# Patient Record
Sex: Male | Born: 1975 | Race: Black or African American | Hispanic: No | Marital: Single | Smoking: Current some day smoker
Health system: Southern US, Community
[De-identification: ages and names within clinical notes are randomized; demographics above are authoritative.]

---

## 1998-01-27 ENCOUNTER — Emergency Department (HOSPITAL_COMMUNITY): Admission: EM | Admit: 1998-01-27 | Discharge: 1998-01-27 | Payer: Self-pay | Admitting: Emergency Medicine

## 1998-02-11 ENCOUNTER — Encounter: Admission: RE | Admit: 1998-02-11 | Discharge: 1998-02-11 | Payer: Self-pay | Admitting: Family Medicine

## 2000-05-03 ENCOUNTER — Emergency Department (HOSPITAL_COMMUNITY): Admission: EM | Admit: 2000-05-03 | Discharge: 2000-05-04 | Payer: Self-pay | Admitting: Emergency Medicine

## 2000-05-03 ENCOUNTER — Encounter: Payer: Self-pay | Admitting: Emergency Medicine

## 2000-05-04 ENCOUNTER — Encounter: Payer: Self-pay | Admitting: Emergency Medicine

## 2002-04-30 ENCOUNTER — Emergency Department (HOSPITAL_COMMUNITY): Admission: EM | Admit: 2002-04-30 | Discharge: 2002-04-30 | Payer: Self-pay | Admitting: Emergency Medicine

## 2002-05-23 ENCOUNTER — Emergency Department (HOSPITAL_COMMUNITY): Admission: EM | Admit: 2002-05-23 | Discharge: 2002-05-23 | Payer: Self-pay | Admitting: Emergency Medicine

## 2002-06-21 ENCOUNTER — Emergency Department (HOSPITAL_COMMUNITY): Admission: EM | Admit: 2002-06-21 | Discharge: 2002-06-21 | Payer: Self-pay | Admitting: Emergency Medicine

## 2004-02-23 ENCOUNTER — Emergency Department (HOSPITAL_COMMUNITY): Admission: EM | Admit: 2004-02-23 | Discharge: 2004-02-23 | Payer: Self-pay | Admitting: Emergency Medicine

## 2006-11-19 ENCOUNTER — Emergency Department (HOSPITAL_COMMUNITY): Admission: EM | Admit: 2006-11-19 | Discharge: 2006-11-19 | Payer: Self-pay | Admitting: Emergency Medicine

## 2007-05-14 ENCOUNTER — Emergency Department (HOSPITAL_COMMUNITY): Admission: EM | Admit: 2007-05-14 | Discharge: 2007-05-14 | Payer: Self-pay | Admitting: Emergency Medicine

## 2009-01-07 ENCOUNTER — Emergency Department (HOSPITAL_COMMUNITY): Admission: EM | Admit: 2009-01-07 | Discharge: 2009-01-07 | Payer: Self-pay | Admitting: Emergency Medicine

## 2013-07-12 ENCOUNTER — Encounter (HOSPITAL_COMMUNITY): Payer: Self-pay | Admitting: Emergency Medicine

## 2013-07-12 ENCOUNTER — Emergency Department (HOSPITAL_COMMUNITY)
Admission: EM | Admit: 2013-07-12 | Discharge: 2013-07-12 | Discharge status: Against Medical Advice | Payer: Self-pay | Attending: Emergency Medicine | Admitting: Emergency Medicine

## 2013-07-12 DIAGNOSIS — Z87898 Personal history of other specified conditions: Secondary | ICD-10-CM

## 2013-07-12 DIAGNOSIS — F17200 Nicotine dependence, unspecified, uncomplicated: Secondary | ICD-10-CM | POA: Insufficient documentation

## 2013-07-12 DIAGNOSIS — F1010 Alcohol abuse, uncomplicated: Secondary | ICD-10-CM | POA: Insufficient documentation

## 2013-07-12 NOTE — ED Notes (Signed)
Bed: ZO10 Expected date: 07/12/13 Expected time: 10:18 PM Means of arrival: Ambulance Comments: ETOH

## 2013-07-12 NOTE — ED Notes (Signed)
Pt left AMA after talking with Dr Ranae Palms

## 2013-07-12 NOTE — ED Notes (Signed)
Pt arrived via EMS with a complaint of alcohol intoxication.  Pt reportedly has been drinking all day and EMS was called by family when he became unresponsive and had shallow breathing.  Pt's family told EMS that he had a syncopal episode and that was what they were called for.  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   Pt refused IV start by EMS and has been back and forth as to what his intentions are.  At this writing pt wants help to stop drinking.

## 2013-07-12 NOTE — ED Provider Notes (Signed)
EMS called for questionable unresponsive episode after drinking. Patient is now requesting to leave emergency department before evaluation. He is alert and oriented x4. He has no clinical signs of intoxication such slurred speech or gait disturbance. He is ambulating without assistance. He appears to have decision-making capacity. He moves all extremities without deficit. He has no obvious trauma. He is refusing further examination. Patient left the emergency department AGAINST MEDICAL ADVICE.  Loren Racer, MD 07/12/13 [id number]

## 2013-07-12 NOTE — ED Notes (Signed)
Pt walked to the bathroom without any assistance and provided a urine sample

## 2013-07-12 NOTE — ED Notes (Signed)
Pt walked out after talking with Dr.  Rock Nephew made an indication that he wasn't going to sign any paperwork.  Pt left AMA

## 2021-08-15 ENCOUNTER — Inpatient Hospital Stay (HOSPITAL_COMMUNITY)
Admission: EM | Admit: 2021-08-15 | Discharge: 2021-08-18 | DRG: 472 | Disposition: A | Discharge status: Home / Self Care | Payer: Self-pay | Attending: Neurological Surgery | Admitting: Neurological Surgery

## 2021-08-15 ENCOUNTER — Other Ambulatory Visit: Payer: Self-pay

## 2021-08-15 ENCOUNTER — Encounter (HOSPITAL_COMMUNITY): Payer: Self-pay | Admitting: *Deleted

## 2021-08-15 ENCOUNTER — Emergency Department (HOSPITAL_COMMUNITY): Payer: Self-pay

## 2021-08-15 DIAGNOSIS — M545 Low back pain, unspecified: Secondary | ICD-10-CM | POA: Diagnosis present

## 2021-08-15 DIAGNOSIS — R202 Paresthesia of skin: Secondary | ICD-10-CM

## 2021-08-15 DIAGNOSIS — Z20822 Contact with and (suspected) exposure to covid-19: Secondary | ICD-10-CM | POA: Diagnosis present

## 2021-08-15 DIAGNOSIS — G992 Myelopathy in diseases classified elsewhere: Secondary | ICD-10-CM | POA: Diagnosis present

## 2021-08-15 DIAGNOSIS — G8929 Other chronic pain: Secondary | ICD-10-CM | POA: Diagnosis present

## 2021-08-15 DIAGNOSIS — R269 Unspecified abnormalities of gait and mobility: Secondary | ICD-10-CM

## 2021-08-15 DIAGNOSIS — F17200 Nicotine dependence, unspecified, uncomplicated: Secondary | ICD-10-CM | POA: Diagnosis present

## 2021-08-15 DIAGNOSIS — M4802 Spinal stenosis, cervical region: Principal | ICD-10-CM | POA: Diagnosis present

## 2021-08-15 DIAGNOSIS — Z419 Encounter for procedure for purposes other than remedying health state, unspecified: Secondary | ICD-10-CM

## 2021-08-15 DIAGNOSIS — G959 Disease of spinal cord, unspecified: Secondary | ICD-10-CM | POA: Diagnosis present

## 2021-08-15 DIAGNOSIS — R200 Anesthesia of skin: Secondary | ICD-10-CM

## 2021-08-15 NOTE — ED Triage Notes (Signed)
The pt is c/o his lt leg being numb for two omnths  no pain

## 2021-08-15 NOTE — ED Provider Triage Note (Signed)
Emergency Medicine Provider Triage Evaluation Note  Douglas Mclaughlin , a 46 y.o. male  was evaluated in triage.  Pt complains of left leg issue onset 2 months.  He notes that he when he works his job he sometimes feels like there is a question to the top of his knee.  Hasn't tried any medications for his symptoms. Denies back pain, fever, chills, color change, wound.  Denies any new injury or trauma.  Review of Systems  Positive: As per HPI.  Negative: Back pain, fever, chills  Physical Exam  BP 128/80 (BP Location: Right Arm)    Pulse 74    Temp 98.4 F (36.9 C) (Oral)    Resp 17    Ht 6\' 1"  (1.854 m)    Wt 77.1 kg    SpO2 97%    BMI 22.43 kg/m  Gen:   Awake, no distress   Resp:  Normal effort  MSK:   Moves extremities without difficulty  Other:  No tenderness to palpation noted to left knee.  No obvious deformity or overlying erythema or swelling noted.  No lumbar midline spinal tenderness to palpation.  No tenderness to palpation noted to lumbar musculature.  Decreased sensation noted to left knee.  Medical Decision Making  Medically screening exam initiated at 10:49 PM.  Appropriate orders placed.  was informed that the remainder of the evaluation will be completed by another provider, this initial triage assessment does not replace that evaluation, and the importance of remaining in the ED until their evaluation is complete.   Rileyann Florance A, PA-C 08/15/21 2309

## 2021-08-16 ENCOUNTER — Observation Stay (HOSPITAL_COMMUNITY): Payer: Self-pay

## 2021-08-16 ENCOUNTER — Emergency Department (HOSPITAL_COMMUNITY): Payer: Self-pay

## 2021-08-16 DIAGNOSIS — G959 Disease of spinal cord, unspecified: Secondary | ICD-10-CM | POA: Diagnosis present

## 2021-08-16 LAB — CBC WITH DIFFERENTIAL/PLATELET
Abs Immature Granulocytes: 0.02 10*3/uL (ref 0.00–0.07)
Basophils Absolute: 0 10*3/uL (ref 0.0–0.1)
Basophils Relative: 0 %
Eosinophils Absolute: 0 10*3/uL (ref 0.0–0.5)
Eosinophils Relative: 0 %
HCT: 45.7 % (ref 39.0–52.0)
Hemoglobin: 15.1 g/dL (ref 13.0–17.0)
Immature Granulocytes: 0 %
Lymphocytes Relative: 45 %
Lymphs Abs: 3.1 10*3/uL (ref 0.7–4.0)
MCH: 30.5 pg (ref 26.0–34.0)
MCHC: 33 g/dL (ref 30.0–36.0)
MCV: 92.3 fL (ref 80.0–100.0)
Monocytes Absolute: 0.4 10*3/uL (ref 0.1–1.0)
Monocytes Relative: 6 %
Neutro Abs: 3.4 10*3/uL (ref 1.7–7.7)
Neutrophils Relative %: 49 %
Platelets: 194 10*3/uL (ref 150–400)
RBC: 4.95 MIL/uL (ref 4.22–5.81)
RDW: 13.7 % (ref 11.5–15.5)
WBC: 6.9 10*3/uL (ref 4.0–10.5)
nRBC: 0 % (ref 0.0–0.2)

## 2021-08-16 LAB — CK: Total CK: 206 U/L (ref 49–397)

## 2021-08-16 LAB — COMPREHENSIVE METABOLIC PANEL
ALT: 12 U/L (ref 0–44)
AST: 16 U/L (ref 15–41)
Albumin: 4.2 g/dL (ref 3.5–5.0)
Alkaline Phosphatase: 73 U/L (ref 38–126)
Anion gap: 10 (ref 5–15)
BUN: 7 mg/dL (ref 6–20)
CO2: 24 mmol/L (ref 22–32)
Calcium: 9.4 mg/dL (ref 8.9–10.3)
Chloride: 105 mmol/L (ref 98–111)
Creatinine, Ser: 0.92 mg/dL (ref 0.61–1.24)
GFR, Estimated: >60 mL/min (ref >60)
Glucose, Bld: 84 mg/dL (ref 70–99)
Potassium: 3.9 mmol/L (ref 3.5–5.1)
Sodium: 139 mmol/L (ref 135–145)
Total Bilirubin: 1.3 mg/dL — ABNORMAL HIGH (ref 0.3–1.2)
Total Protein: 7.4 g/dL (ref 6.5–8.1)

## 2021-08-16 LAB — RESP PANEL BY RT-PCR (FLU A&B, COVID) ARPGX2
Influenza A by PCR: NEGATIVE
Influenza B by PCR: NEGATIVE
SARS Coronavirus 2 by RT PCR: NEGATIVE

## 2021-08-16 LAB — MRSA NEXT GEN BY PCR, NASAL: MRSA by PCR Next Gen: NOT DETECTED

## 2021-08-16 LAB — MAGNESIUM: Magnesium: 2 mg/dL (ref 1.7–2.4)

## 2021-08-16 IMAGING — CT CT HEAD W/O CM
4 series · 16 of 47 positions shown, 18 images · non-contrast
Comparison: None.

CLINICAL DATA: Neuro deficit, acute, stroke suspected. Two months
of left lower extremity numbness and weakness.



[Series 3: head without · axial · non-contrast · 0.46mm/px · z∈[-27,+93]mm · 7 of 34 slices shown, 9 images]
[im 5/34  brain]
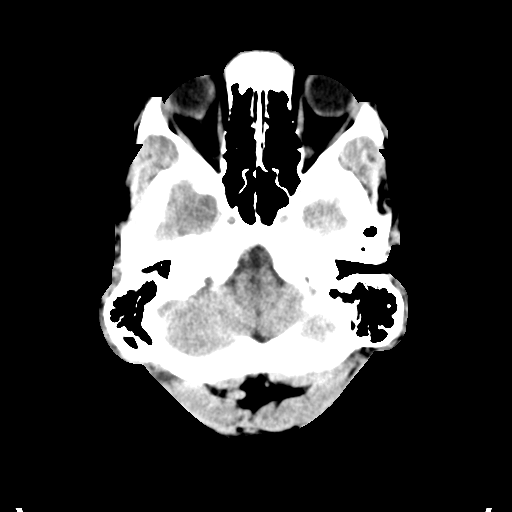
[im 5/34  bone]
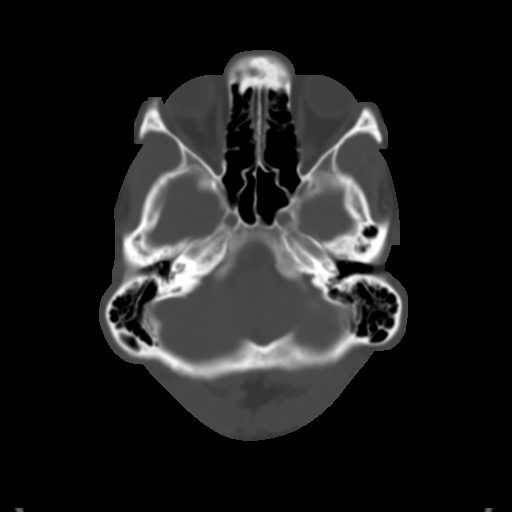
[im 9/34  brain]
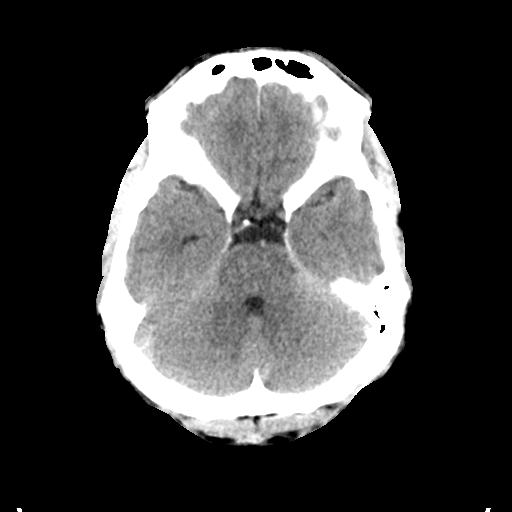
[im 13/34  brain]
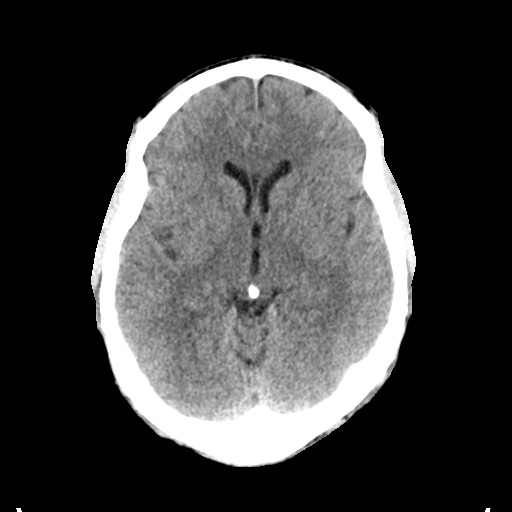
[im 17/34  brain]
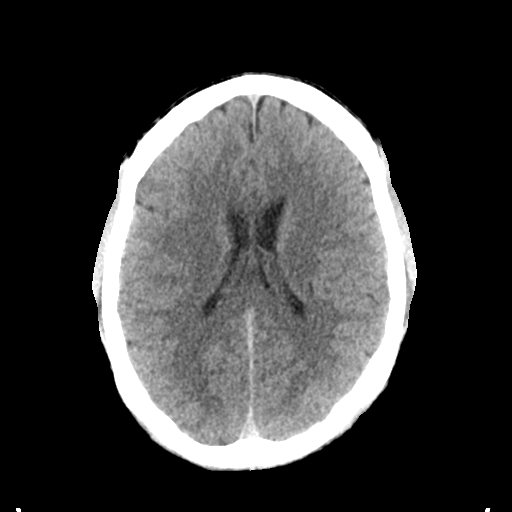
[im 21/34  brain]
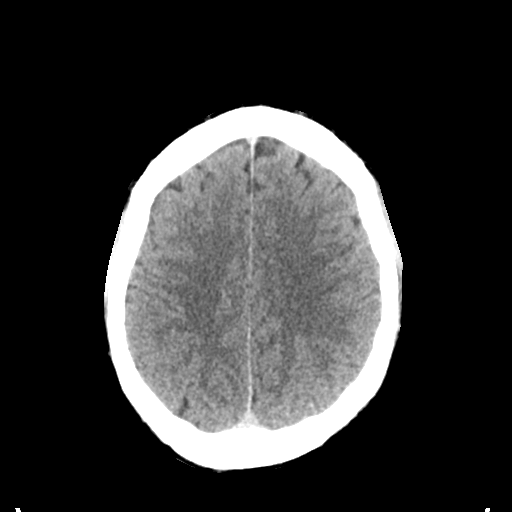
[im 21/34  bone]
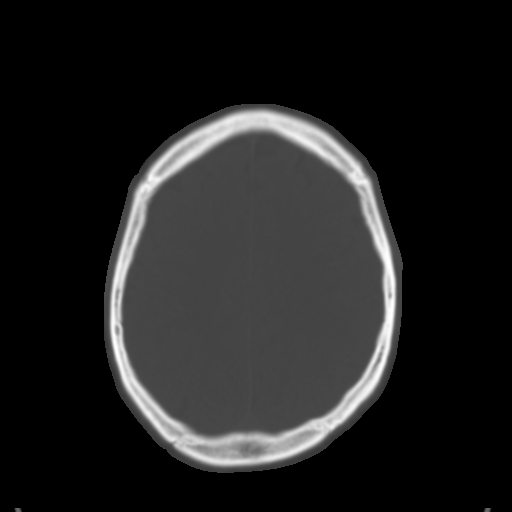
[im 25/34  brain]
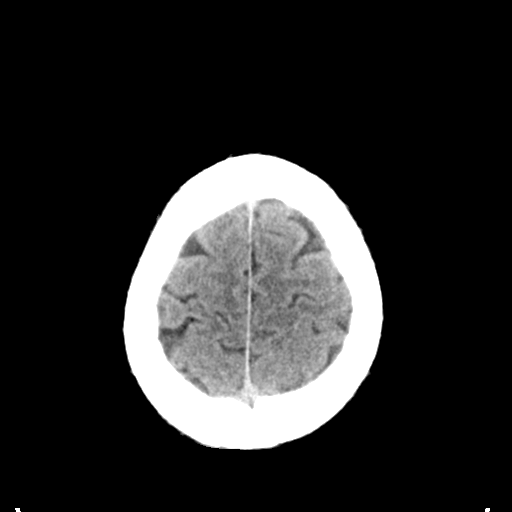
[im 29/34  brain]
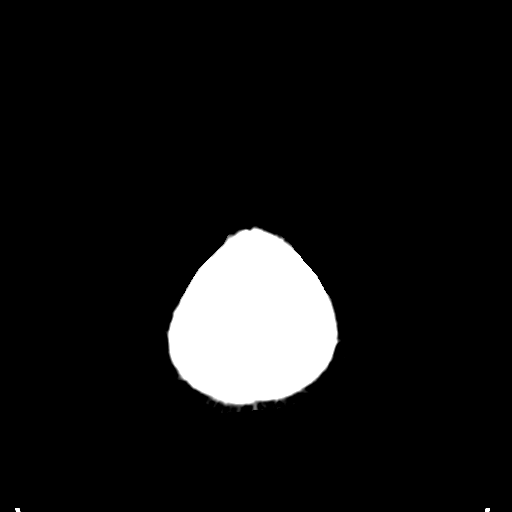

[Series 4: head bone · axial · 0.46mm/px · z∈[-31,+1]mm · 3 of 83 slices shown]
[im 9/83  bone]
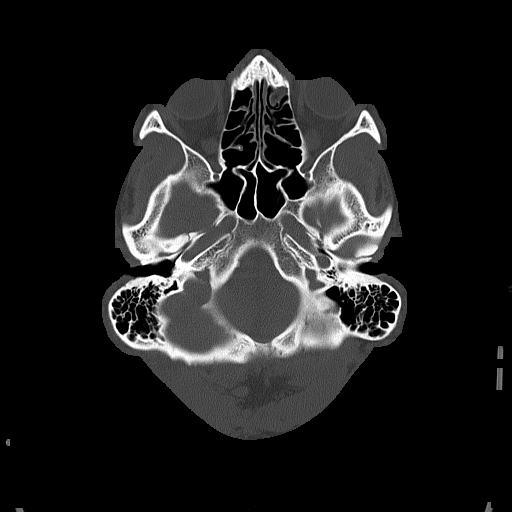
[im 17/83  bone]
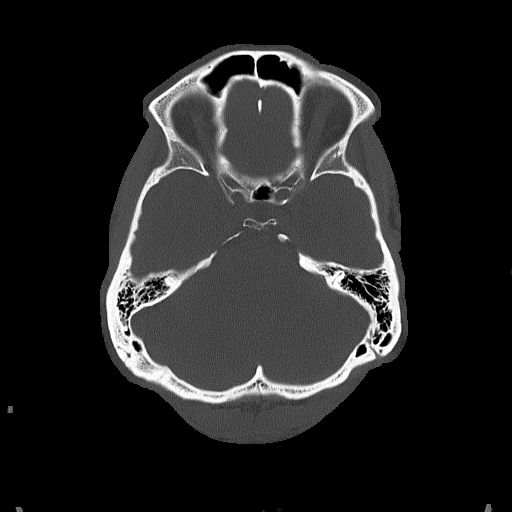
[im 25/83  bone]
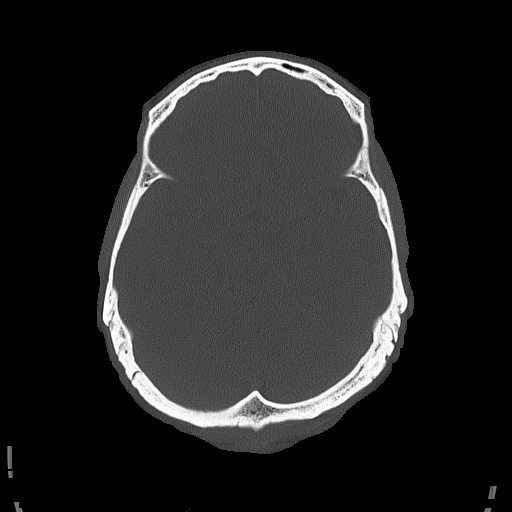

[Series 5: head without cor · coronal · non-contrast · 0.33mm/px · 3 of 74 slices shown]
[im 25/74  brain]
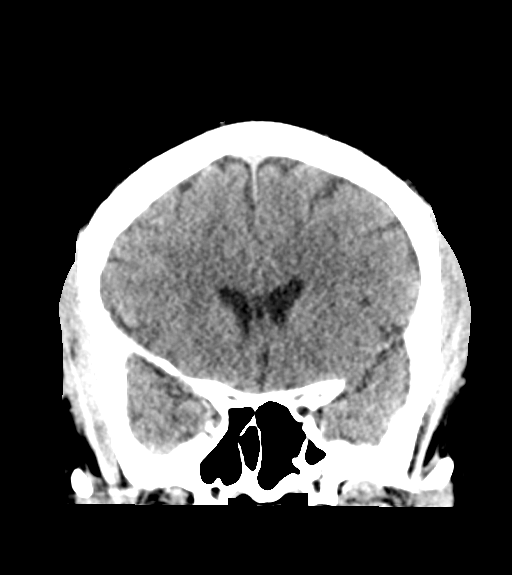
[im 33/74  brain]
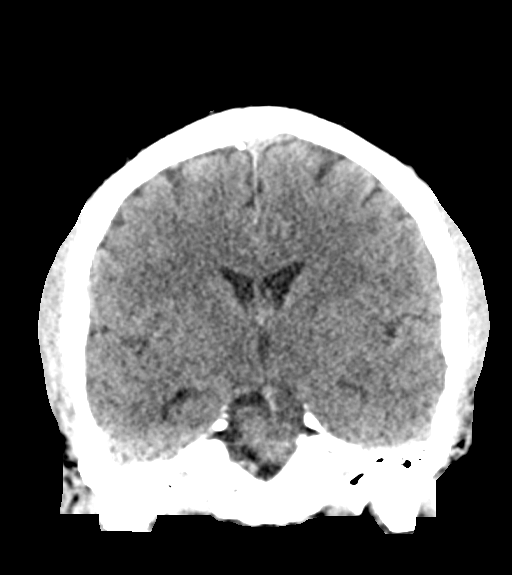
[im 41/74  brain]
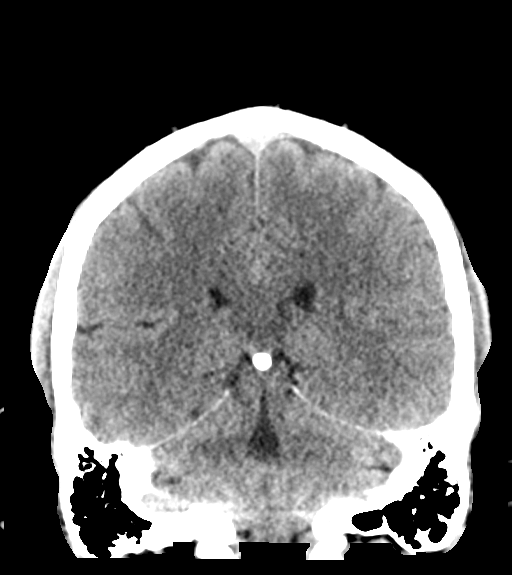

[Series 6: head without sag · sagittal · non-contrast · 0.36mm/px · 3 of 67 slices shown]
[im 23/67  brain]
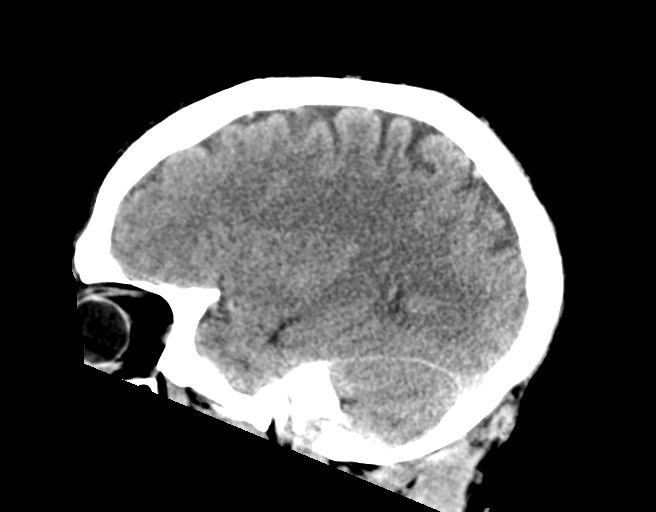
[im 34/67  brain]
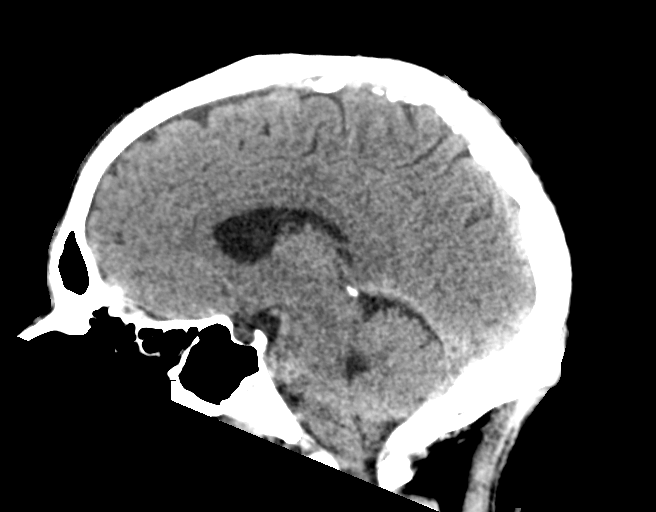
[im 45/67  brain]
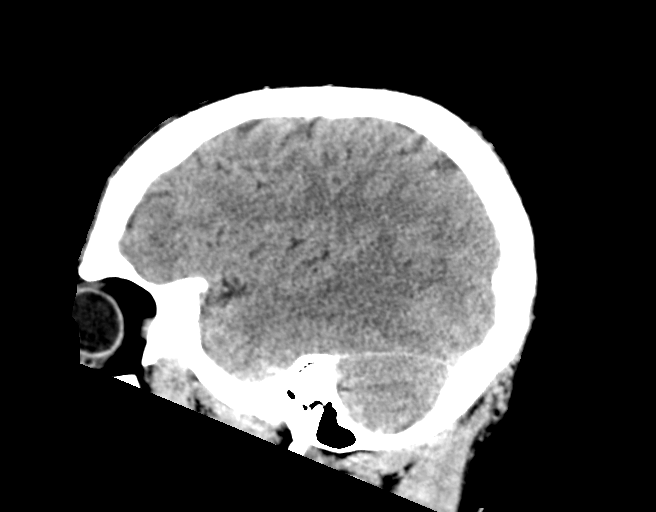

[16 of 47 positions shown; findings below may reference images not displayed]

FINDINGS: Brain: No acute intracranial hemorrhage, midline shift or mass
effect. No extra-axial fluid collection. Gray-white matter
differentiation is within normal limits. No hydrocephalus.

Vascular: No hyperdense vessel or unexpected calcification.

Skull: Normal. Negative for fracture or focal lesion.

Sinuses/Orbits: No acute finding.

Other: None.
IMPRESSION: No acute intracranial process.

## 2021-08-16 IMAGING — MR MR THORACIC SPINE W/O CM
4 of 6 series · 19 of 48 positions shown · non-contrast
Comparison: None.

CLINICAL DATA: Myelopathy, acute, thoracic spine versus cervical
spine

EXAM:
MRI THORACIC SPINE WITHOUT CONTRAST
TECHNIQUE: Multiplanar, multisequence MR imaging of the thoracic spine was
performed. No intravenous contrast was administered.

[Series 3: T1 · sagittal · 3.0mm · 0.90mm/px · 3 of 15 slices shown (1 of 2)]
[im 1/15]
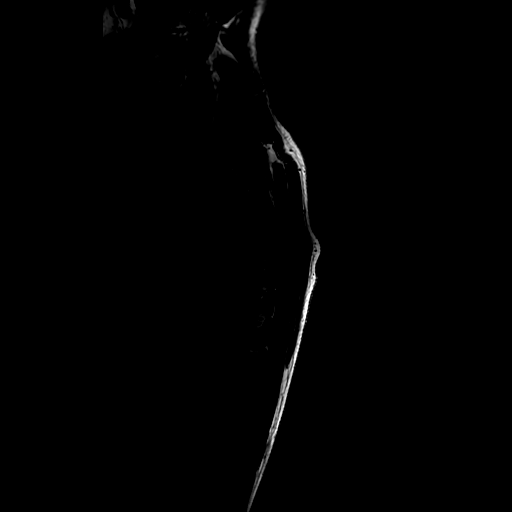
[im 8/15]
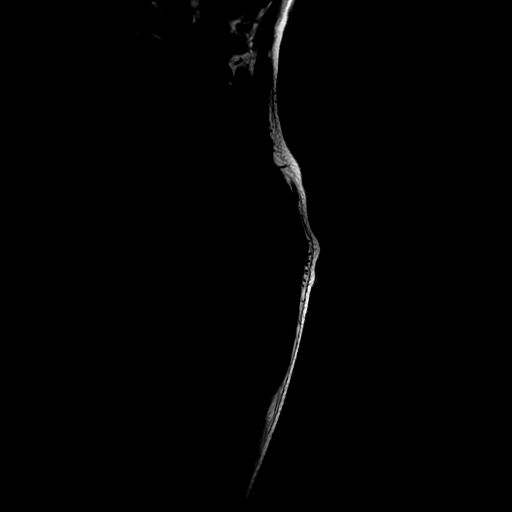
[im 15/15]
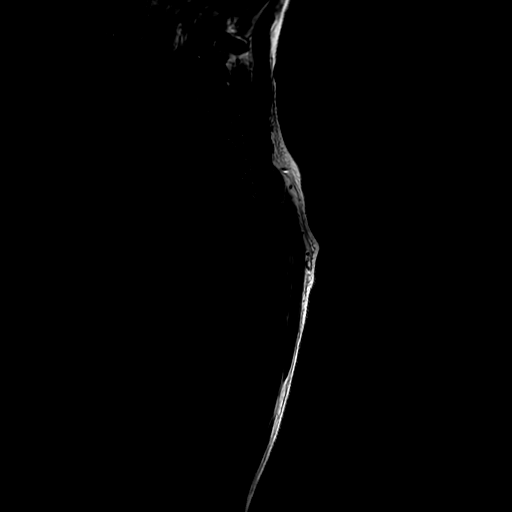

[Series 4: T2 · sagittal · 3.0mm · 0.66mm/px · 5 of 17 slices shown (1 of 2)]
[im 1/17]
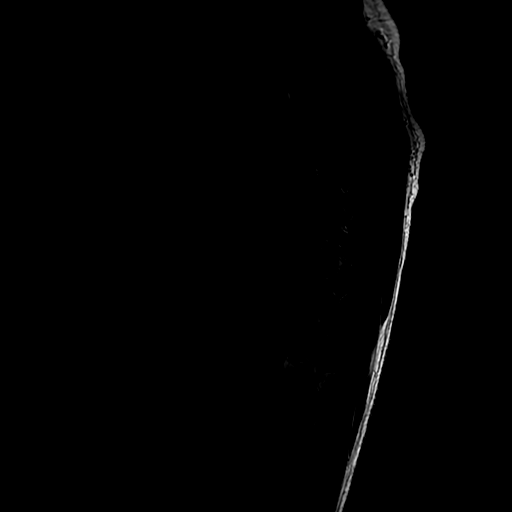
[im 5/17]
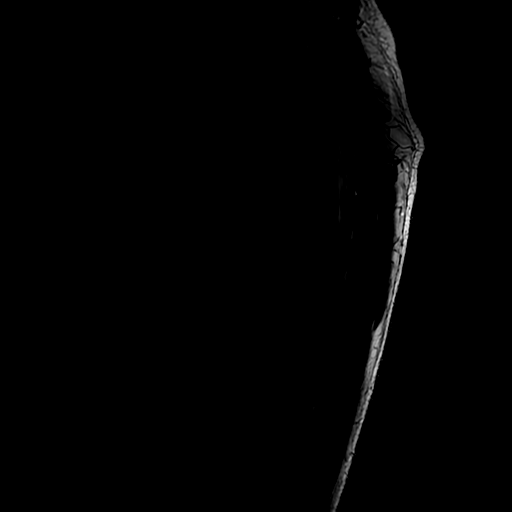
[im 9/17]
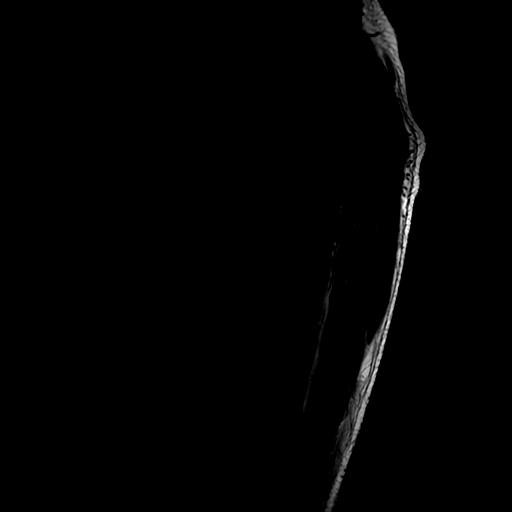
[im 13/17]
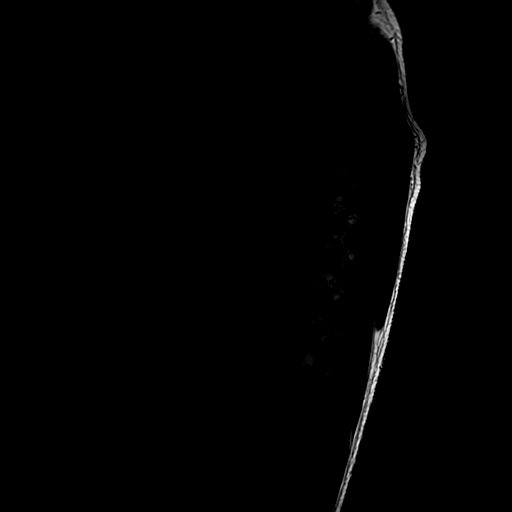
[im 17/17]
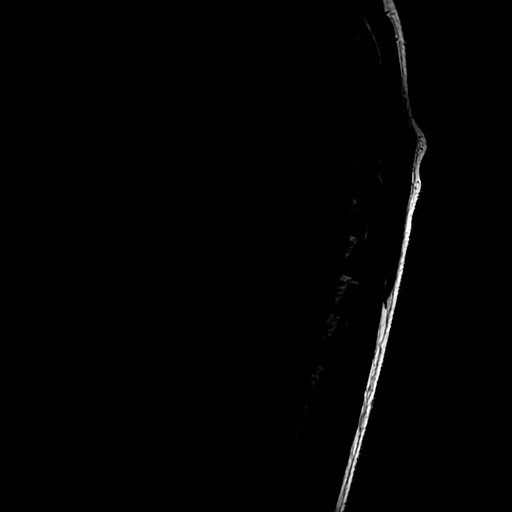

[Series 6: T1 · sagittal · 3.0mm · 0.66mm/px · 3 of 17 slices shown (2 of 2)]
[im 1/17]
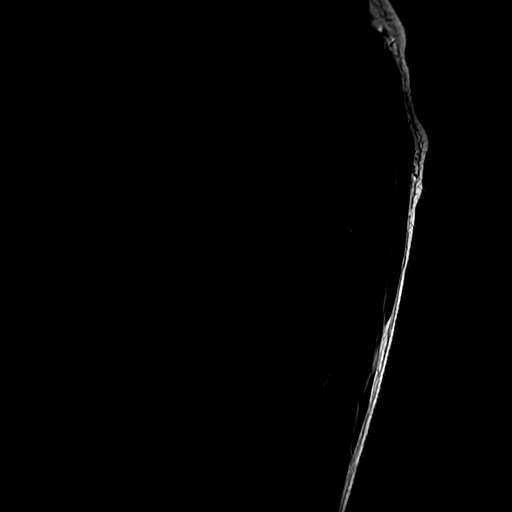
[im 11/17]
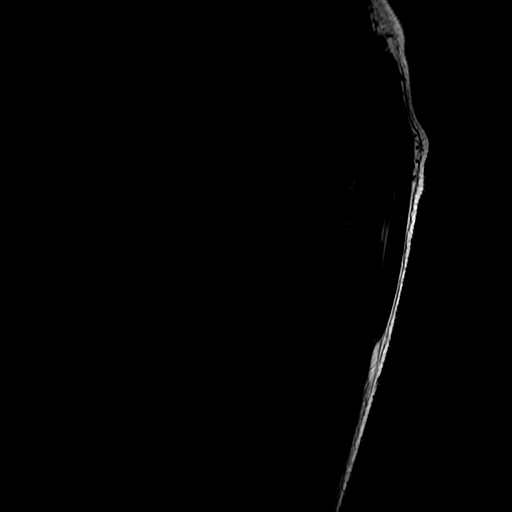
[im 17/17]
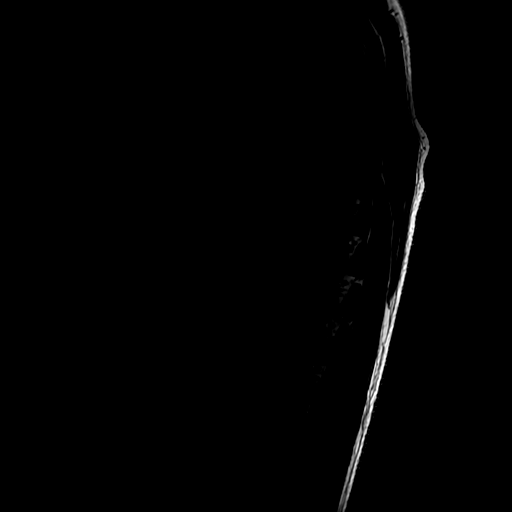

[Series 7: T2 · axial · 4.0mm · 0.39mm/px · z∈[-392,-135]mm · 8 of 58 slices shown (2 of 2)]
[im 1/58]
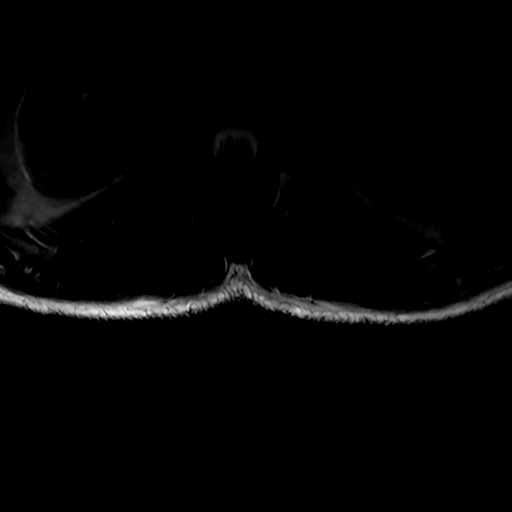
[im 9/58]
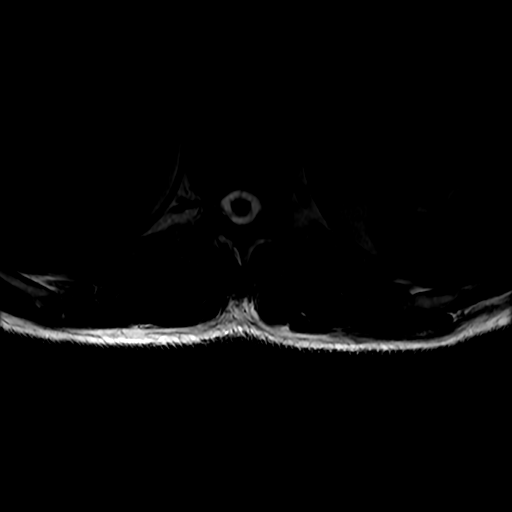
[im 17/58]
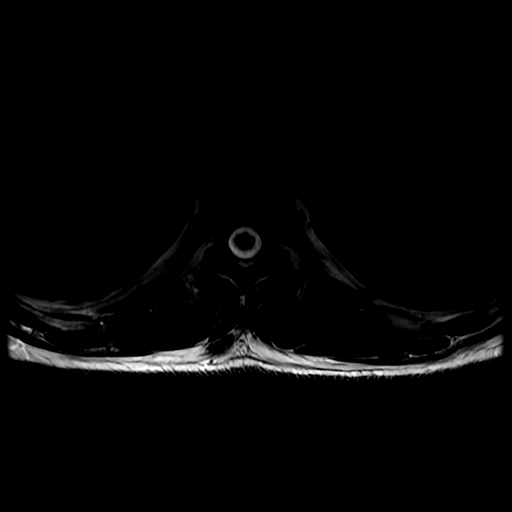
[im 25/58]
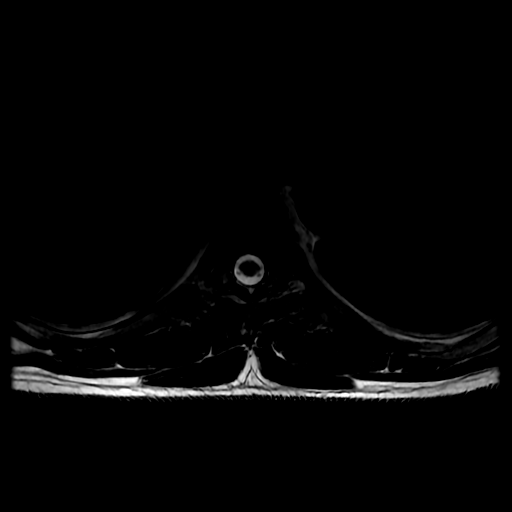
[im 29/58]
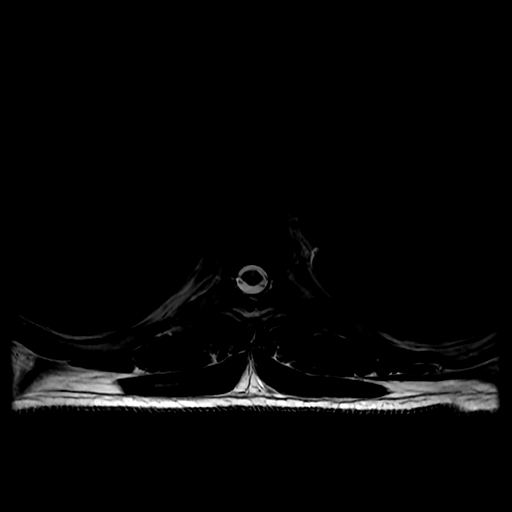
[im 33/58]
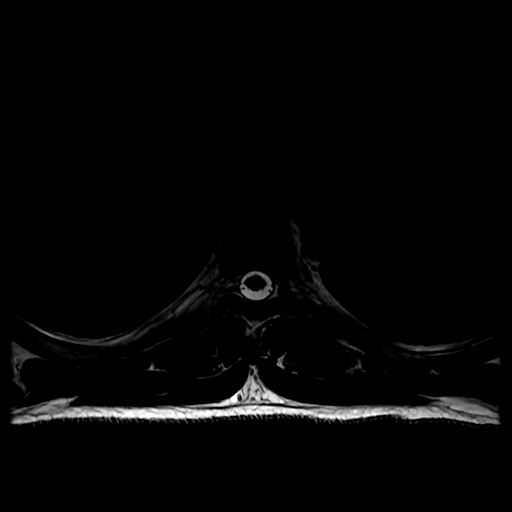
[im 41/58]
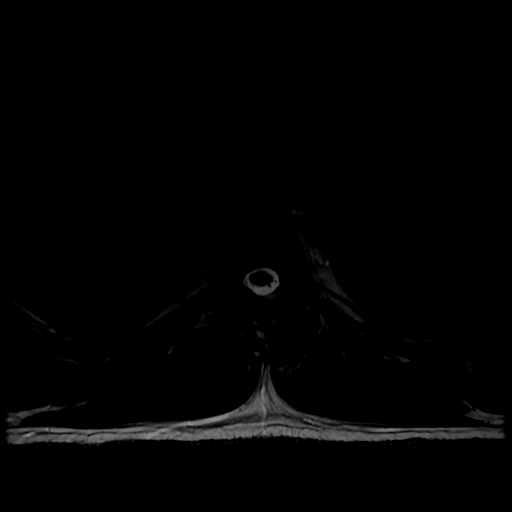
[im 49/58]
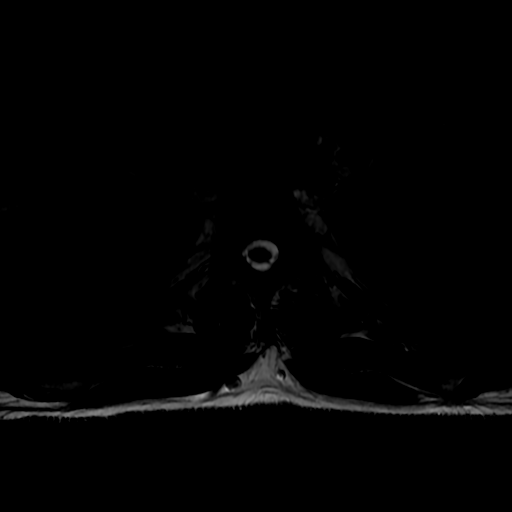

[19 of 48 positions shown; findings below may reference images not displayed]

FINDINGS: Alignment: Mild scoliotic curvature convex to the left in the upper
thoracic region.

Vertebrae: No fracture or focal bone lesion.

Cord:  No cord compression or focal cord lesion.

Paraspinal and other soft tissues: Negative

Disc levels:

Disc levels are normal except for minimal non-compressive disc
bulges at T2-3, T3-4 and T4-5. There is mild hypertrophic change of
the facet joints at T2-3, T3-4 and T4-5, but there is no compressive
narrowing of the canal. There is mild foraminal narrowing on the
right at T3-4 and T4-5 and on the left at T2-3 and T3-4.
IMPRESSION: No fracture or focal lesion.

No central canal stenosis. No cord compression or focal cord lesion.

Minimal non-compressive disc bulges at T2-3, T3-4 and T4-5. Upper
thoracic facet osteoarthritis with mild foraminal narrowing on the
right at T3-4 and T4-5 and on the left at T2-3 and T3-4. Often, this
level of abnormality is subclinical.

## 2021-08-16 IMAGING — MR MR CERVICAL SPINE W/O CM
4 of 6 series · 18 of 48 positions shown · non-contrast
Comparison: None.

CLINICAL DATA: Myelopathy, acute, cervical spine cervical versus
thoracic myelopathy. Upper extremity numbness

EXAM:
MRI CERVICAL SPINE WITHOUT CONTRAST
TECHNIQUE: Multiplanar, multisequence MR imaging of the cervical spine was
performed. No intravenous contrast was administered.

[Series 2: T2 · sagittal · 3.0mm · 0.43mm/px · 4 of 19 slices shown (1 of 2)]
[im 1/19]
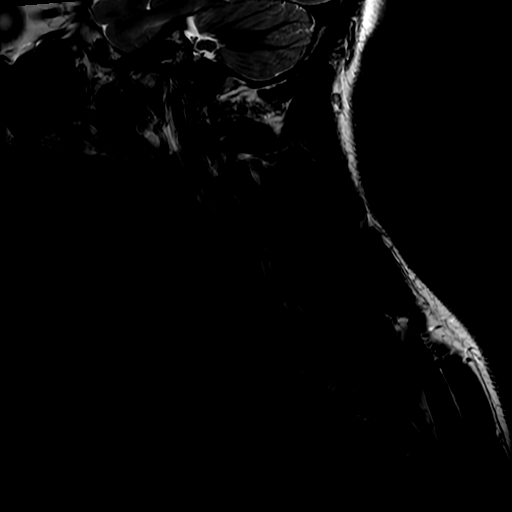
[im 7/19]
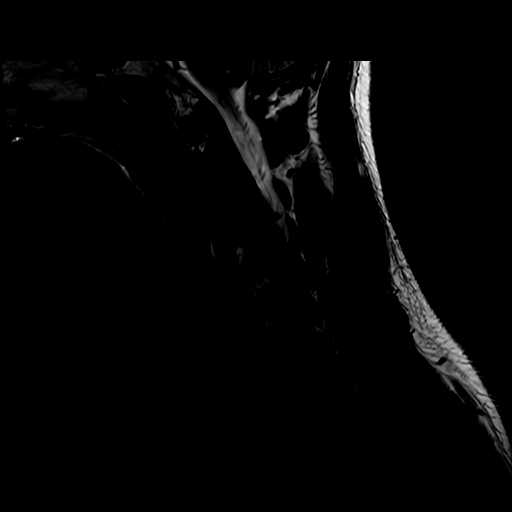
[im 13/19]
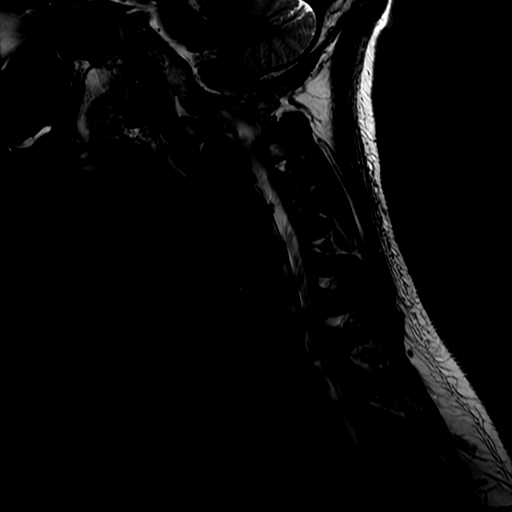
[im 19/19]
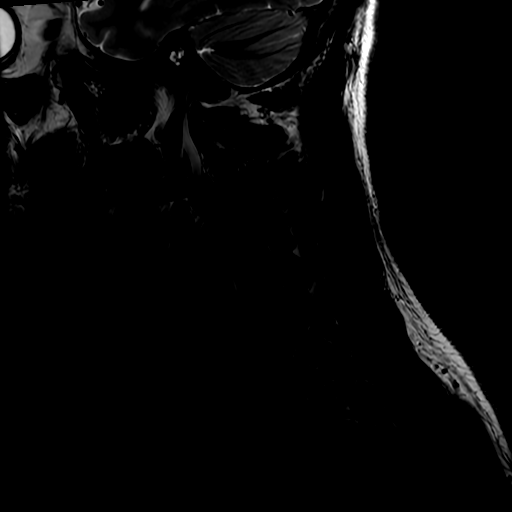

[Series 4: STIR · sagittal · 3.0mm · 0.43mm/px · 3 of 19 slices shown]
[im 1/19]
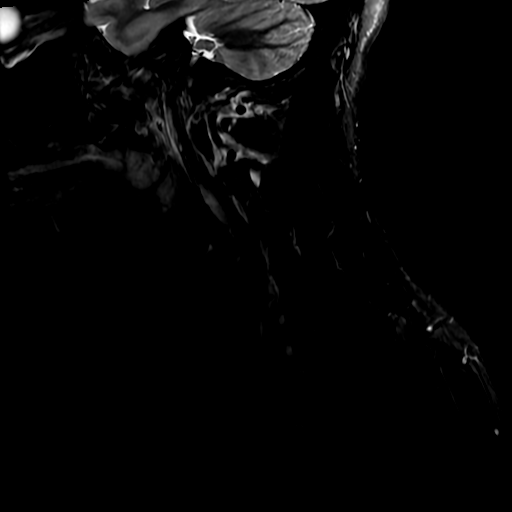
[im 10/19]
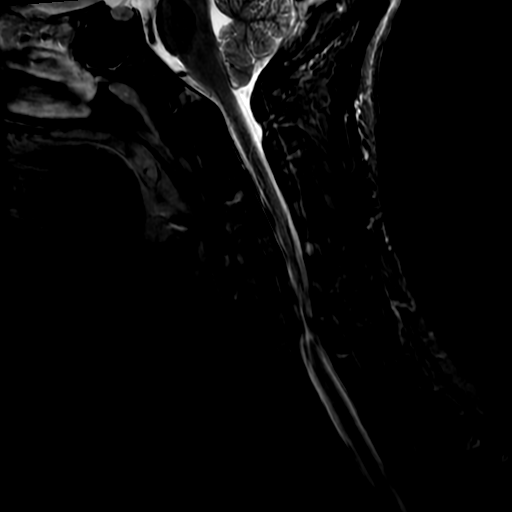
[im 19/19]
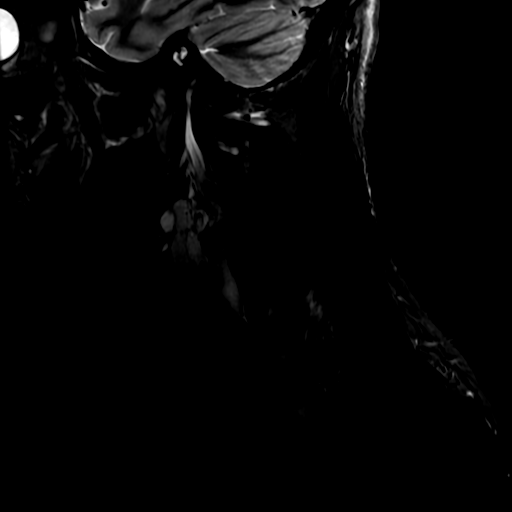

[Series 6: T2 · axial · 3.0mm · 0.35mm/px · z∈[-133,+8]mm · 8 of 45 slices shown (2 of 2)]
[im 1/45]
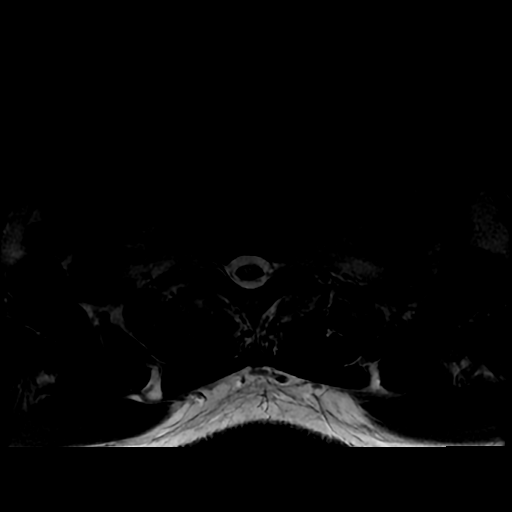
[im 7/45]
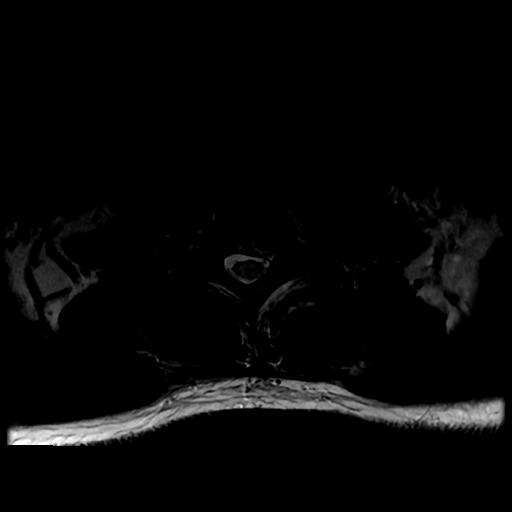
[im 13/45]
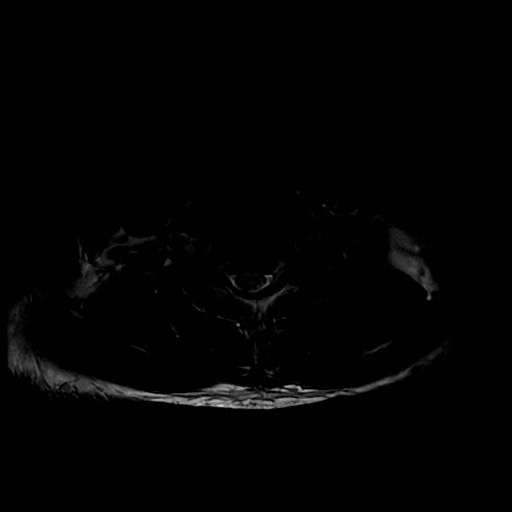
[im 19/45]
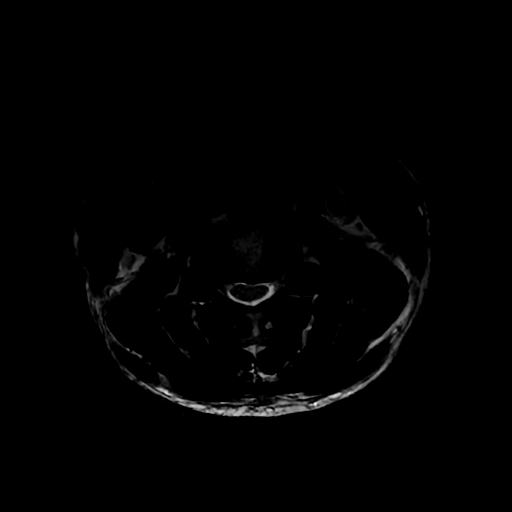
[im 26/45]
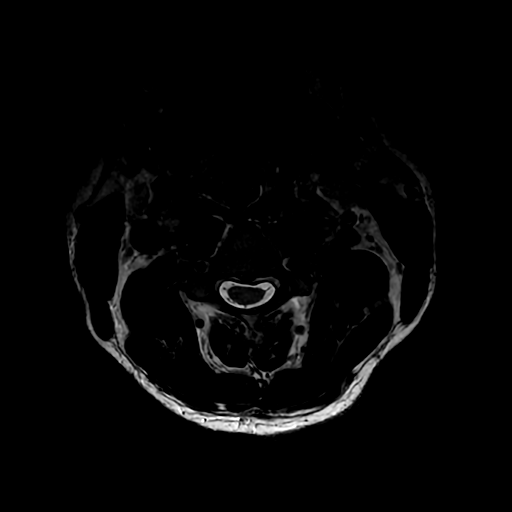
[im 32/45]
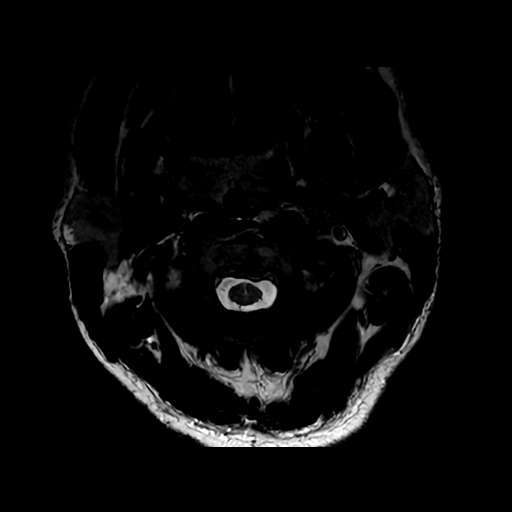
[im 38/45]
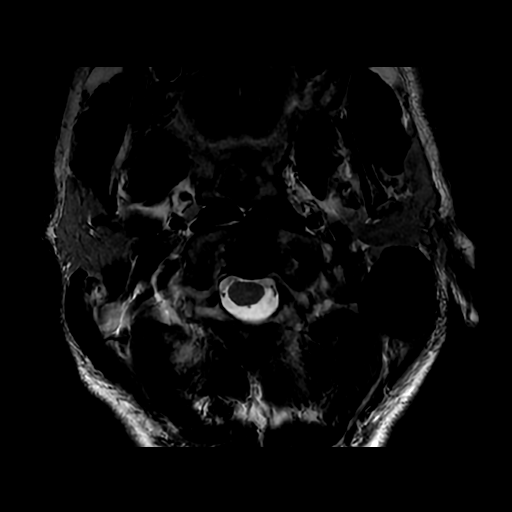
[im 45/45]
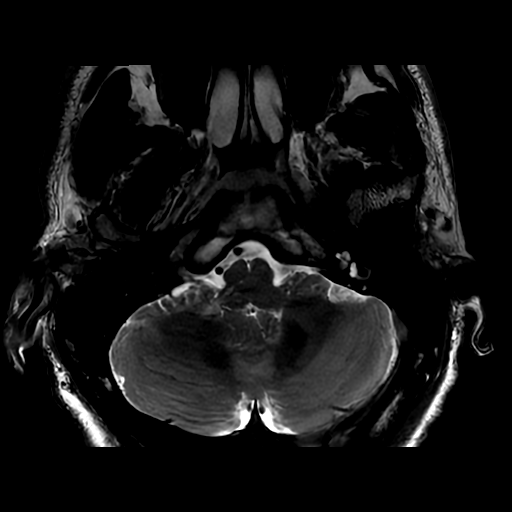

[Series 7: T1 · axial · non-contrast · 3.0mm · 0.35mm/px · z∈[-114,-15]mm · 3 of 45 slices shown]
[im 7/45]
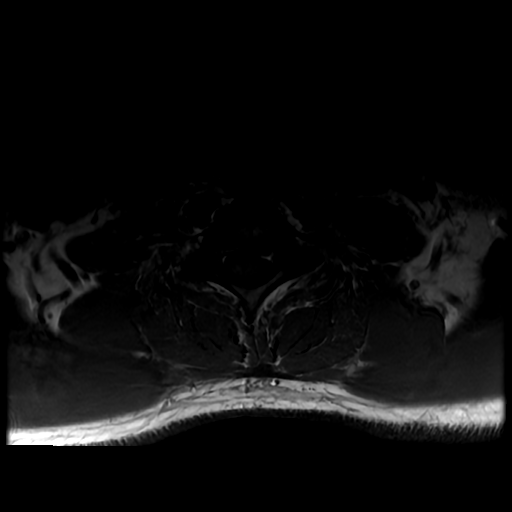
[im 26/45]
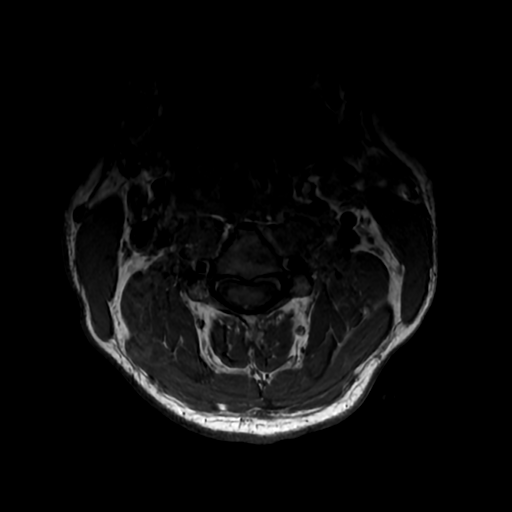
[im 38/45]
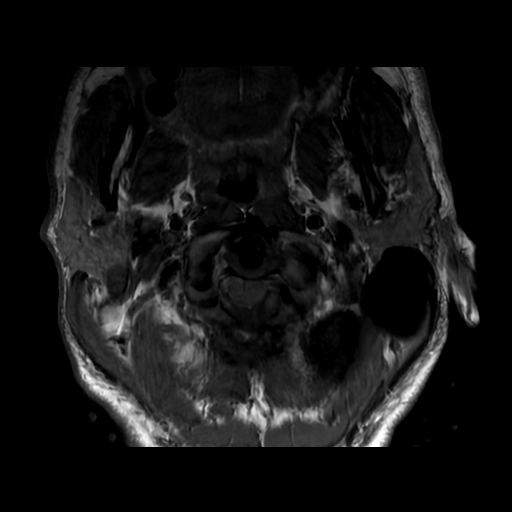

[18 of 48 positions shown; findings below may reference images not displayed]

FINDINGS: Alignment: Reversal of the cervical lordosis. Slight retrolisthesis
at C4-5 and C5-6.

Vertebrae: No acute fracture. No evidence of discitis. Multilevel
discogenic endplate marrow changes. No marrow replacing bone lesion.
Mild diffuse intrinsic canal narrowing on the basis of congenitally
short pedicles.

Cord: Focally increased T2/STIR signal within the cervical cord at
the C6-7 level (series 2, image 10). Cord is slightly diminutive in
caliber at this location likely reflecting chronic myelomalacia. No
sites of cord enlargement. No additional sites of cervical cord
signal abnormality.

Posterior Fossa, vertebral arteries, paraspinal tissues: Negative.

Disc levels:

C2-C3: Left paracentral disc osteophyte complex contacts the left
hemicord resulting in mild canal stenosis. Mild left foraminal
stenosis.

C3-C4: Disc osteophyte complex, slightly eccentric to the left with
left greater than right uncovertebral spurring. Findings result in
mild-to-moderate canal stenosis with mild-moderate left and mild
right foraminal stenosis.

C4-C5: Disc osteophyte complex with bilateral uncovertebral spurring
resulting in moderate canal stenosis with moderate bilateral
foraminal stenosis.

C5-C6: Disc osteophyte complex, eccentric to the left with
uncovertebral spurring resulting in moderate canal stenosis with
severe left and mild-moderate right foraminal stenosis.

C6-C7: Disc osteophyte complex, eccentric to the left with left
greater than right uncovertebral spurring resulting in severe canal
stenosis with severe left and mild-moderate right foraminal
stenosis.

C7-T1: No disc protrusion. Left-sided uncovertebral spurring
contributes to mild-moderate left-sided foraminal stenosis. No canal
stenosis.
IMPRESSION: 1. Multilevel degenerative disc disease superimposed on a
congenitally narrowed canal resulting in multilevel canal stenosis,
severe at C6-7.
2. Focally increased T2/STIR signal within the cervical cord at the
C6-7 level, suggesting compressive myelomalacia, likely chronic.
3. Severe left foraminal stenosis at C5-6 and C6-7.
4. Moderate canal stenosis at C4-5 and C5-6.

## 2021-08-16 IMAGING — MR MR LUMBAR SPINE W/O CM
4 of 5 series · 18 of 48 positions shown · non-contrast
Comparison: None.

CLINICAL DATA: Left lower extremity weakness and numbness

EXAM:
MRI LUMBAR SPINE WITHOUT CONTRAST
TECHNIQUE: Multiplanar, multisequence MR imaging of the lumbar spine was
performed. No intravenous contrast was administered.

[Series 2: T2 · sagittal · 4.0mm · 0.55mm/px · 6 of 15 slices shown (1 of 2)]
[im 1/15]
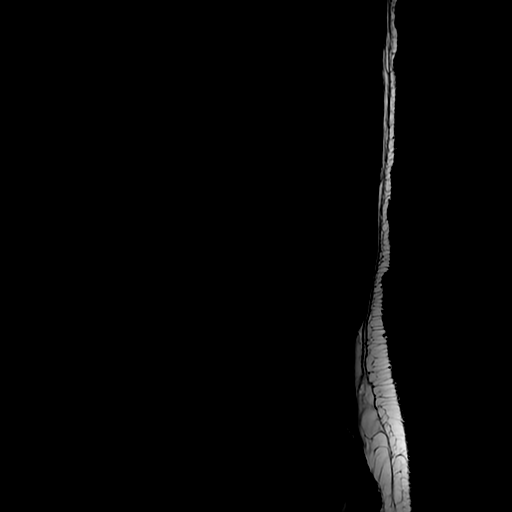
[im 3/15]
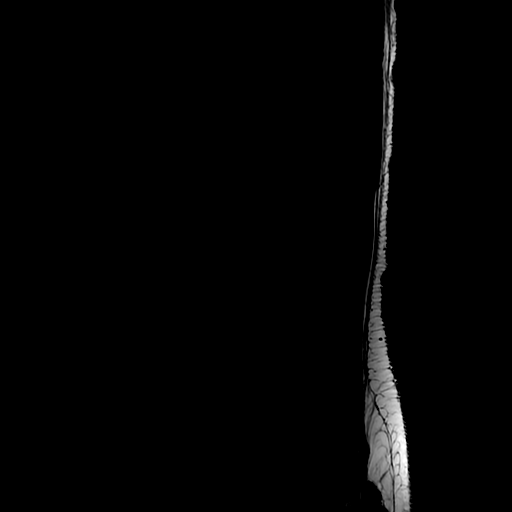
[im 6/15]
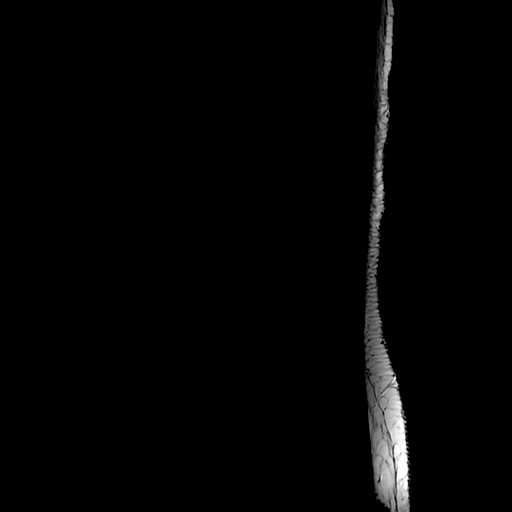
[im 9/15]
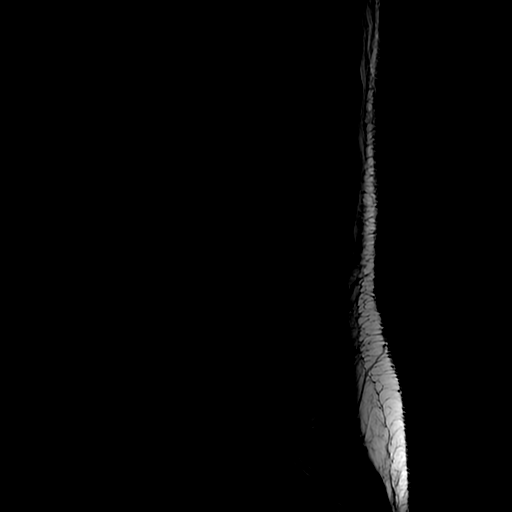
[im 12/15]
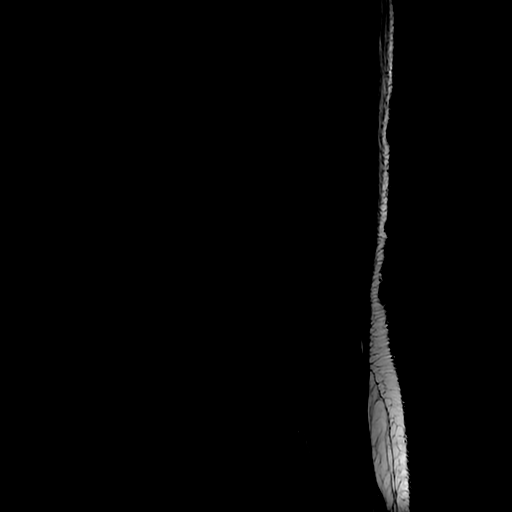
[im 15/15]
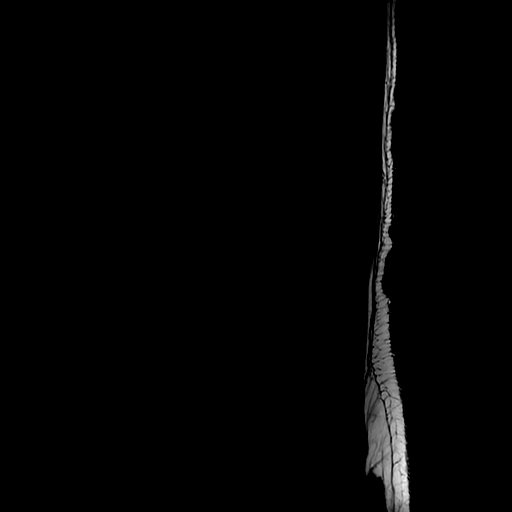

[Series 4: T1 · sagittal · 4.0mm · 0.55mm/px · 3 of 15 slices shown (1 of 2)]
[im 1/15]
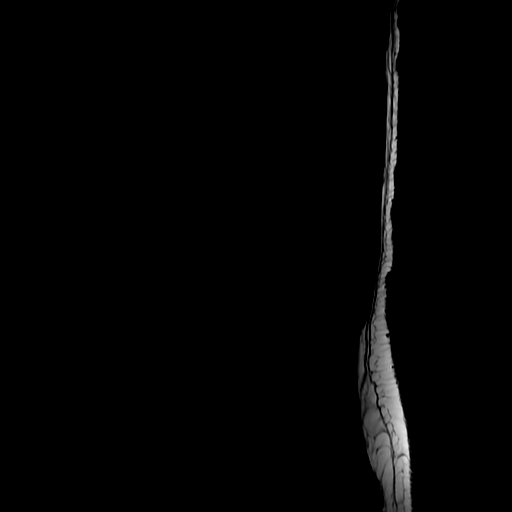
[im 8/15]
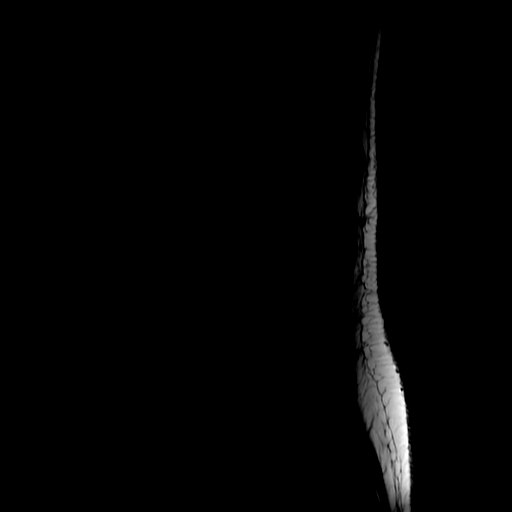
[im 15/15]
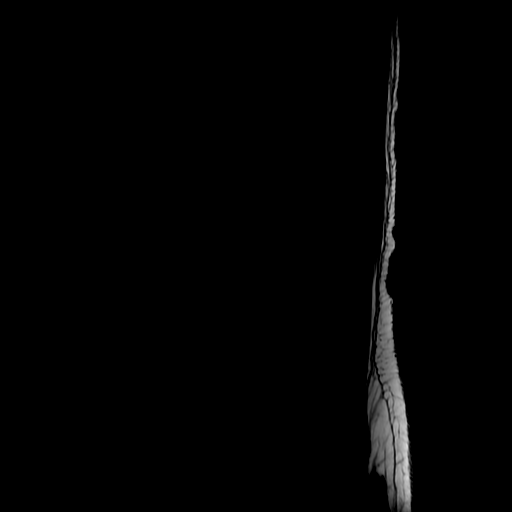

[Series 5: T2 · axial · 4.0mm · 0.39mm/px · z∈[-30,+149]mm · 6 of 46 slices shown (2 of 2)]
[im 4/46]
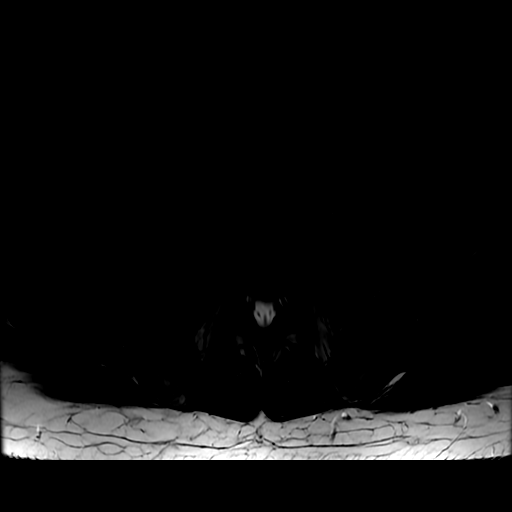
[im 7/46]
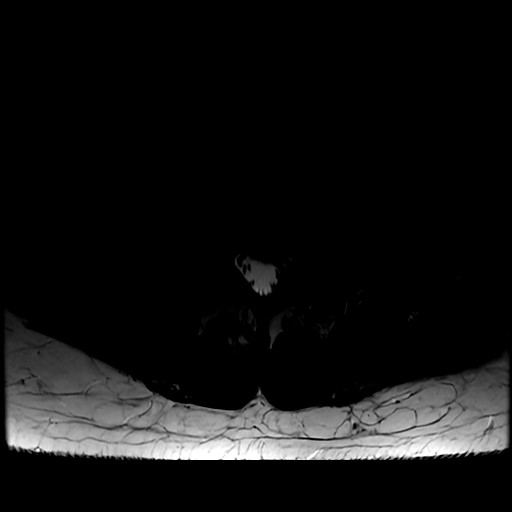
[im 10/46]
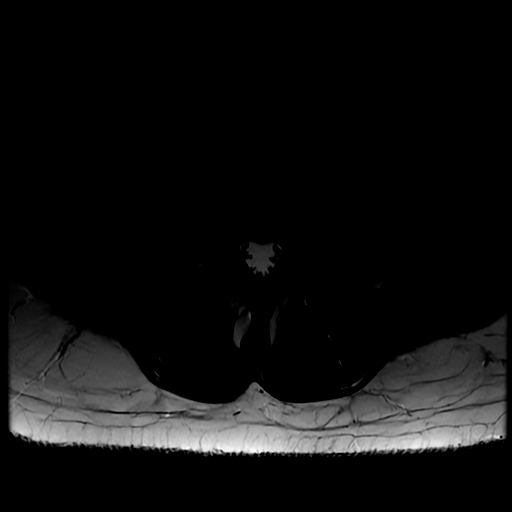
[im 16/46]
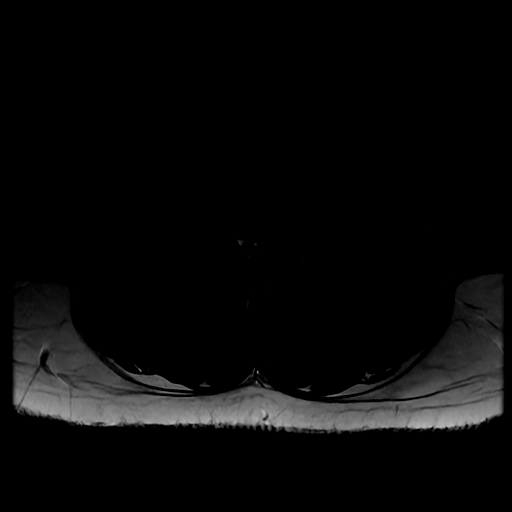
[im 25/46]
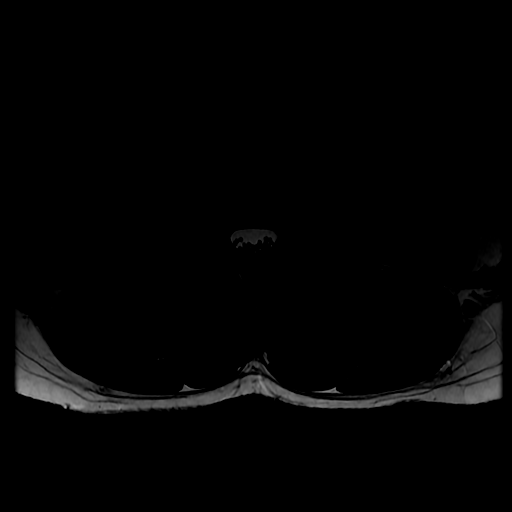
[im 40/46]
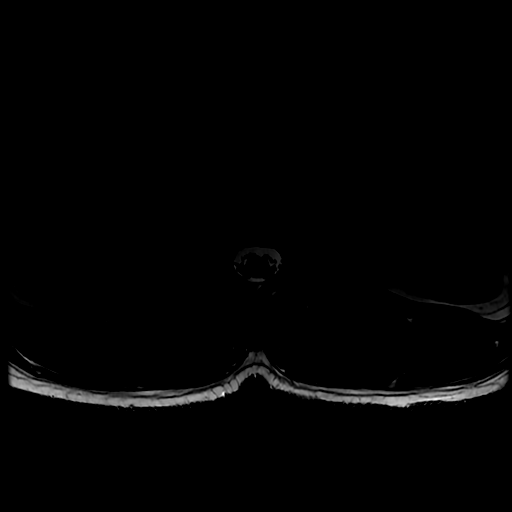

[Series 6: T1 · axial · 4.0mm · 0.39mm/px · z∈[-15,+149]mm · 3 of 46 slices shown (2 of 2)]
[im 7/46]
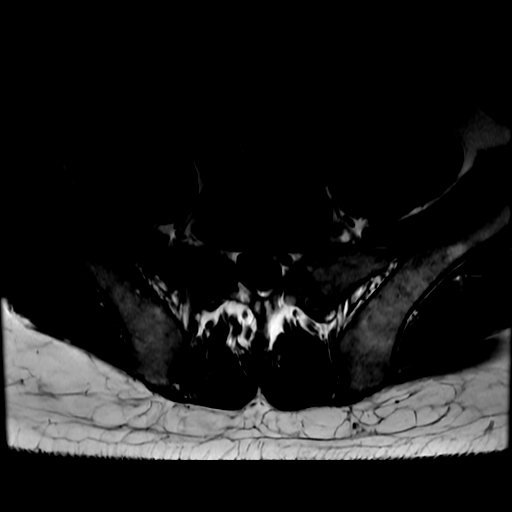
[im 25/46]
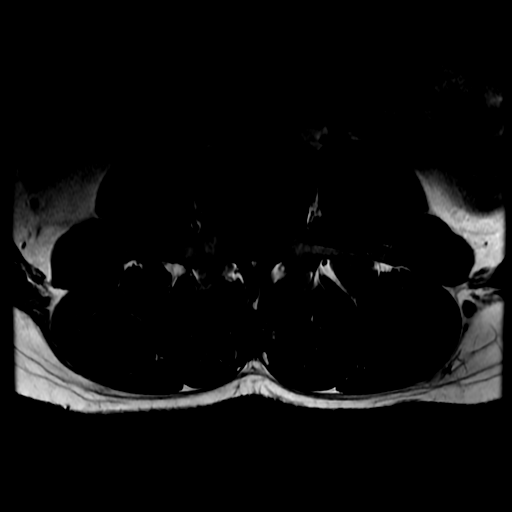
[im 40/46]
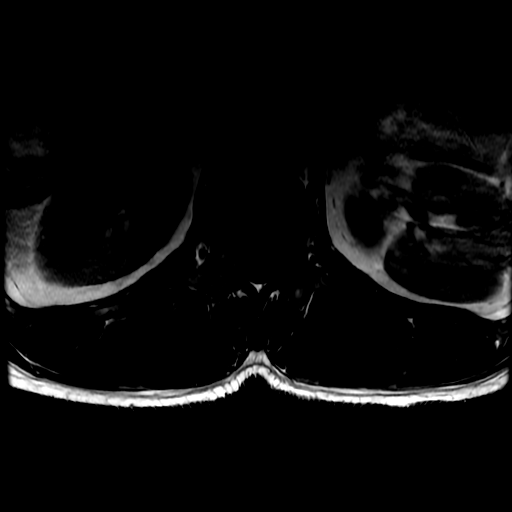

[18 of 48 positions shown; findings below may reference images not displayed]

FINDINGS: Segmentation: Standard; the lowest formed disc space is designated
L5-S1

Alignment:  Normal.

Vertebrae: Vertebral body heights are preserved. There is minimal
degenerative endplate marrow signal abnormality at L4-L5. There is
no suspicious marrow signal abnormality.

Conus medullaris and cauda equina: Conus extends to the L1-L2 level.
Conus and cauda equina appear normal.

Paraspinal and other soft tissues: There is edema within the
interspinous space at L4-L5. The paraspinal soft tissues are
otherwise unremarkable.

Disc levels:

There is disc desiccation and mild narrowing at L4-L5. The other
disc spaces are overall preserved. There is mild multilevel facet
arthropathy, most advanced at L5-S1.

T12-L1: No significant spinal canal or neural foraminal stenosis.

L1-L2: No significant spinal canal or neural foraminal stenosis.

L2-L3: No significant spinal canal or neural foraminal stenosis.

L3-L4: No significant spinal canal or neural foraminal stenosis.

L4-L5: There is a diffuse disc bulge with a superimposed central
protrusion and mild bilateral facet arthropathy resulting in
moderate spinal canal stenosis with effacement of the subarticular
zones, left worse than right, with probable impingement of the
traversing left L5 nerve root, and mild bilateral neural foraminal
stenosis.

L5-S1: There is left worse than right facet arthropathy without
significant spinal canal or neural foraminal stenosis.
IMPRESSION: 1. Diffuse disc bulge with a superimposed central protrusion at
L4-L5 resulting in moderate spinal canal stenosis with effacement of
the subarticular zones and probable impingement of the traversing
left L5 nerve root, and mild bilateral neural foraminal stenosis.
2. Minimal degenerative changes at the remaining levels without
other significant spinal canal or neural foraminal stenosis.
3. Edema in the interspinous space at L4-L5 can be seen with
Baastrup disease.

## 2021-08-16 MED ORDER — ONDANSETRON HCL 4 MG/2ML IJ SOLN: 4.0000 mg | Freq: Four times a day (QID) | INTRAMUSCULAR | Status: DC | PRN

## 2021-08-16 MED ORDER — SODIUM CHLORIDE 0.9% FLUSH: 3.0000 mL | INTRAVENOUS | Status: DC | PRN

## 2021-08-16 MED ORDER — OXYCODONE HCL 5 MG PO TABS
10.0000 mg | ORAL_TABLET | ORAL | Status: DC | PRN
  Administered 2021-08-17 (×2): 10 mg via ORAL
  Filled 2021-08-16 (×2): qty 2

## 2021-08-16 MED ORDER — CEFAZOLIN SODIUM-DEXTROSE 2-4 GM/100ML-% IV SOLN
2.0000 g | INTRAVENOUS | Status: AC
  Administered 2021-08-17: 11:00:00 2 g via INTRAVENOUS
  Filled 2021-08-16: qty 100

## 2021-08-16 MED ORDER — ONDANSETRON HCL 4 MG PO TABS: 4.0000 mg | ORAL_TABLET | Freq: Four times a day (QID) | ORAL | Status: DC | PRN

## 2021-08-16 MED ORDER — PHENOL 1.4 % MT LIQD
1.0000 | OROMUCOSAL | Status: DC | PRN
  Filled 2021-08-16: qty 177

## 2021-08-16 MED ORDER — SODIUM CHLORIDE 0.9 % IV SOLN: INTRAVENOUS | Status: DC

## 2021-08-16 MED ORDER — ACETAMINOPHEN 650 MG RE SUPP: 650.0000 mg | RECTAL | Status: DC | PRN

## 2021-08-16 MED ORDER — POLYETHYLENE GLYCOL 3350 17 G PO PACK: 17.0000 g | PACK | Freq: Every day | ORAL | Status: DC | PRN

## 2021-08-16 MED ORDER — MENTHOL 3 MG MT LOZG
1.0000 | LOZENGE | OROMUCOSAL | Status: DC | PRN
  Filled 2021-08-16: qty 9

## 2021-08-16 MED ORDER — SODIUM CHLORIDE 0.9 % IV SOLN: 250.0000 mL | INTRAVENOUS | Status: DC

## 2021-08-16 MED ORDER — ACETAMINOPHEN 325 MG PO TABS: 650.0000 mg | ORAL_TABLET | ORAL | Status: DC | PRN

## 2021-08-16 MED ORDER — CYCLOBENZAPRINE HCL 10 MG PO TABS
10.0000 mg | ORAL_TABLET | Freq: Three times a day (TID) | ORAL | Status: DC | PRN
  Administered 2021-08-16 – 2021-08-18 (×3): 10 mg via ORAL
  Filled 2021-08-16 (×3): qty 1

## 2021-08-16 MED ORDER — SODIUM CHLORIDE 0.9% FLUSH
3.0000 mL | Freq: Two times a day (BID) | INTRAVENOUS | Status: DC
  Administered 2021-08-16 – 2021-08-17 (×3): 3 mL via INTRAVENOUS

## 2021-08-16 MED ORDER — OXYCODONE HCL 5 MG PO TABS
5.0000 mg | ORAL_TABLET | ORAL | Status: DC | PRN
  Administered 2021-08-16 – 2021-08-18 (×3): 5 mg via ORAL
  Filled 2021-08-16 (×3): qty 1

## 2021-08-16 MED ORDER — DOCUSATE SODIUM 100 MG PO CAPS
100.0000 mg | ORAL_CAPSULE | Freq: Two times a day (BID) | ORAL | Status: DC
  Administered 2021-08-16 – 2021-08-17 (×4): 100 mg via ORAL
  Filled 2021-08-16 (×5): qty 1

## 2021-08-16 MED ORDER — HYDROMORPHONE HCL 1 MG/ML IJ SOLN
1.0000 mg | INTRAMUSCULAR | Status: DC | PRN
  Administered 2021-08-16: 1 mg via INTRAVENOUS
  Filled 2021-08-16: qty 1

## 2021-08-16 NOTE — Progress Notes (Signed)
Pt transferred from ED VS stable A&O x4. Pt educated on belongings policy. Bed in lowest position and call bell within reach.    Belongings at bedside:  Wallet with 5 credit cards 3 $1 bills Pt's ID card

## 2021-08-16 NOTE — Consult Note (Signed)
Neurosurgery Consultation  Reason for Consult: Leg numbness and weakness Referring Physician: Durwin Nora  CC: Leg numbness and weakness  HPI: This is a 46 y.o. man that presents with 2 months of L>R leg numbness and weakness. He presented to the ED today because the stiffness in his legs, especially his left, is preventing him from doing normal activities. No change in bowel or bladder function, no radicular pain in the legs or upper extremities. On ROS, he does endorse weakness and numbness in his left > right upper extremity as well with dropping objects and difficult with fine motor function. No neck pain, some mild back pain/stiffness in the midline of the lumbar spine. The numbness extends from his left toes all the way up his left leg to the inguinal ligament, spares the chest. No recent use of anti-platelet or anti-coagulant medications.   ROS: A 14 point ROS was performed and is negative except as noted in the HPI.   PMHx: History reviewed. No pertinent past medical history. FamHx: No family history on file. SocHx:  reports that he has been smoking. He does not have any smokeless tobacco history on file. He reports current alcohol use. He reports current drug use.  Exam: Vital signs in last 24 hours: Temp:  [98.3 F (36.8 C)-98.4 F (36.9 C)] 98.3 F (36.8 C) (01/23 0540) Pulse Rate:  [64-74] 64 (01/23 0540) Resp:  [16-17] 16 (01/23 0540) BP: (121-128)/(77-80) 121/77 (01/23 0540) SpO2:  [96 %-97 %] 96 % (01/23 0540) Weight:  [77.1 kg] 77.1 kg (01/22 2228) General: Awake, alert, cooperative, lying in bed in NAD Head: Normocephalic and atruamatic HEENT: Neck supple Pulmonary: breathing room air comfortably, no evidence of increased work of breathing Cardiac: RRR Abdomen: S NT ND Extremities: Warm and well perfused x4 Neuro: AOx3, PERRL, EOMI, FS Strength 5/5 x4 except left PF/DF/EHL 4/5, proximal strength exam at least 4 to 4+/5 but significantly increased tone bilaterally that  limits strength testing SILT except fairly dense and consistent numbness throughout the LLE from toes to the inguinal ligament, less severe on the right, and involving the entire bilateral hands, again more left than right Tone normal in BUE but inc'd reflexes at L biceps > R biceps Inc'd tone in BLE with ankle clonus, impressive hyper-reflexia at the achilles/patella bilaterally  Assessment and Plan: 46 y.o. man w/ BLE stiffness/numnbess/weakness w/o radicular pain and upper extremity involvement. Exam most consistent with cervical myelopathy but could have a double-crush involving the lumbar roots that is complicating the picture. MRI L-spine personally reviewed, which shows acute on chronic disc herniation with root compression at L4-5 bilaterally, left greater than right.   -discussed the above with the patient. Given his worsening weakness and impressive exam with rigidity, I think this warrants more urgent workup with admission and MRI C/T-spine -okay for regular diet   Jadene Pierini, MD 08/16/21 10:40 AM [facility]

## 2021-08-16 NOTE — ED Notes (Signed)
Patient transported to MRI 

## 2021-08-16 NOTE — ED Notes (Signed)
Pt to MRI

## 2021-08-16 NOTE — ED Notes (Signed)
Pt transported to MRI 

## 2021-08-16 NOTE — H&P (Signed)
Neurosurgery Consultation   Reason for Consult: Leg numbness and weakness Referring Physician: Durwin Nora   CC: Leg numbness and weakness   HPI: This is a 46 y.o. man that presents with 2 months of L>R leg numbness and weakness. He presented to the ED today because the stiffness in his legs, especially his left, is preventing him from doing normal activities. No change in bowel or bladder function, no radicular pain in the legs or upper extremities. On ROS, he does endorse weakness and numbness in his left > right upper extremity as well with dropping objects and difficult with fine motor function. No neck pain, some mild back pain/stiffness in the midline of the lumbar spine. The numbness extends from his left toes all the way up his left leg to the inguinal ligament, spares the chest. No recent use of anti-platelet or anti-coagulant medications.     ROS: A 14 point ROS was performed and is negative except as noted in the HPI.    PMHx: History reviewed. No pertinent past medical history. FamHx: No family history on file. SocHx:  reports that he has been smoking. He does not have any smokeless tobacco history on file. He reports current alcohol use. He reports current drug use.   Exam: Vital signs in last 24 hours: Temp:  [98.3 F (36.8 C)-98.4 F (36.9 C)] 98.3 F (36.8 C) (01/23 0540) Pulse Rate:  [64-74] 64 (01/23 0540) Resp:  [16-17] 16 (01/23 0540) BP: (121-128)/(77-80) 121/77 (01/23 0540) SpO2:  [96 %-97 %] 96 % (01/23 0540) Weight:  [77.1 kg] 77.1 kg (01/22 2228) General: Awake, alert, cooperative, lying in bed in NAD Head: Normocephalic and atruamatic HEENT: Neck supple Pulmonary: breathing room air comfortably, no evidence of increased work of breathing Cardiac: RRR Abdomen: S NT ND Extremities: Warm and well perfused x4 Neuro: AOx3, PERRL, EOMI, FS Strength 5/5 x4 except left PF/DF/EHL 4/5, proximal strength exam at least 4 to 4+/5 but significantly increased tone bilaterally  that limits strength testing SILT except fairly dense and consistent numbness throughout the LLE from toes to the inguinal ligament, less severe on the right, and involving the entire bilateral hands, again more left than right Tone normal in BUE but inc'd reflexes at L biceps > R biceps Inc'd tone in BLE with ankle clonus, impressive hyper-reflexia at the achilles/patella bilaterally   Assessment and Plan: 46 y.o. man w/ BLE stiffness/numnbess/weakness w/o radicular pain and upper extremity involvement. Exam most consistent with cervical myelopathy but could have a double-crush involving the lumbar roots that is complicating the picture. MRI L-spine personally reviewed, which shows acute on chronic disc herniation with root compression at L4-5 bilaterally, left greater than right.    -discussed the above with the patient. Given his worsening weakness and impressive exam with rigidity, I think this warrants more urgent workup with admission and MRI C/T-spine -okay for regular diet    Jadene Pierini, MD 08/16/21 10:40 AM [facility]  -addendum, MRI C-spine w/ stenosis and cord signal change, will add on for 3rd case tomorrow, NPO after midnight, will put in preop labs and some IV fluids, will discuss further with the patient later tonight or tomorrow morning

## 2021-08-16 NOTE — ED Provider Notes (Signed)
Care of patient assumed from Dr. Kathrynn Humble at 7 AM.  This patient has had 2 months of ongoing left leg weakness and paresthesias.  He is currently awaiting MRI of lumbar spine.  Consult neurosurgery as needed.  If MRI study is reassuring, he would benefit from neurology follow-up. Physical Exam  BP 121/77    Pulse 64    Temp 98.3 F (36.8 C) (Oral)    Resp 16    Ht 6\' 1"  (1.854 m)    Wt 77.1 kg    SpO2 96%    BMI 22.43 kg/m   Physical Exam Constitutional:      Appearance: Normal appearance.  HENT:     Head: Normocephalic and atraumatic.  Pulmonary:     Effort: Pulmonary effort is normal. No respiratory distress.  Abdominal:     General: Abdomen is flat.  Musculoskeletal:        General: No swelling, deformity or signs of injury.     Cervical back: Normal range of motion.     Right lower leg: No edema.     Left lower leg: No edema.     Comments: Diminished active range of motion with knee and hip extension secondary to weakness.  Neurological:     Mental Status: He is alert.     Sensory: Sensory deficit (Diminished sensation throughout left lower extremity) present.     Motor: Weakness (3/5 strength with hip flexion, 4/5 strength with knee extension, minimally decreased strength with ankle plantarflexion and dorsiflexion, when compared to the right) present.  Psychiatric:        Mood and Affect: Mood normal.        Behavior: Behavior normal.    Procedures  Procedures  ED Course / MDM    Medical Decision Making Amount and/or Complexity of Data Reviewed Labs: ordered. Radiology: ordered.  Risk Decision regarding hospitalization.   Patient underwent MRI of lumbar spine which showed probable impingement on L5 nerve root.  Neurosurgery was consulted who came to evaluate the patient in the ED.  Following their evaluation, patient was admitted to their service.       Godfrey Pick, MD 08/17/21 [id number]

## 2021-08-16 NOTE — ED Provider Notes (Addendum)
[facility] EMERGENCY DEPARTMENT Provider Note   CSN: [id number] Arrival date & time: 08/15/21  2217     History  Chief Complaint  Patient presents with   Leg Pain    Douglas Mclaughlin is a 46 y.o. male.  HPI    46 year old male comes in with chief complaint of leg pain and numbness.  He has no significant medical history.  Indicates that he used to smoke heavily until a year ago and used to cocaine until few years ago, and he was drinking heavily until about 10 months ago.  However, 2 months ago he started having tingling sensation in his left leg.  He also started developing a limp due to his left leg when walking.  The limping has worsened more recently, prompting him to come to the ER.  He denies any head injury, neck pain.  He does have a history of chronic lower back pain, but no new injury to that site.  Denies any recent illnesses.  No symptoms to the right lower extremity or left upper extremity.  Home Medications Prior to Admission medications   Medication Sig Start Date End Date Taking? Authorizing Provider  acetaminophen (TYLENOL) 500 MG tablet Take 1,000 mg by mouth every 6 (six) hours as needed for mild pain.   Yes [provider]      Allergies    Patient has no known allergies.    Review of Systems   Review of Systems  Constitutional:  Positive for activity change.  Neurological:  Positive for weakness and numbness.   Physical Exam Updated Vital Signs BP 125/81 (BP Location: Right Arm)    Pulse (!) 49    Temp 98.2 F (36.8 C) (Oral)    Resp 16    Ht 6\' 1"  (1.854 m)    Wt 77.1 kg    SpO2 98%    BMI 22.43 kg/m  Physical Exam Vitals and nursing note reviewed.  Constitutional:      Appearance: He is well-developed.  HENT:     Head: Atraumatic.  Cardiovascular:     Rate and Rhythm: Normal rate.     Pulses: Normal pulses.     Comments: 2+ radial pulse, femoral pulse and dorsalis pedis bilaterally Pulmonary:     Effort:  Pulmonary effort is normal.  Abdominal:     Tenderness: There is no abdominal tenderness.  Musculoskeletal:     Cervical back: Neck supple.     Comments: Midline L spine tenderness  Skin:    General: Skin is warm.  Neurological:     Mental Status: He is alert and oriented to person, place, and time.     Sensory: Sensory deficit present.     Motor: Weakness present.     Comments: Patient has bilateral 2+ patellar reflex, right more brisk than left. He is able to discriminate between sharp and dull in both extremities, but indicates numbness to the left lower extremity in its entirety and pins-and-needles sensation over the left foot. Lower extremity strength is 4+ out of 5 bilaterally -although patient feels left side is weaker. When walking, patient has a clear limp.    ED Results / Procedures / Treatments   Labs (all labs ordered are listed, but only abnormal results are displayed) Labs Reviewed  COMPREHENSIVE METABOLIC PANEL - Abnormal; Notable for the following components:      Result Value   Total Bilirubin 1.3 (*)    All other components within normal limits  RESP PANEL  BY RT-PCR (FLU A&B, COVID) [id number]  MRSA NEXT GEN BY PCR, NASAL  CBC WITH DIFFERENTIAL/PLATELET  MAGNESIUM  CK    EKG None  Radiology DG Knee 2 Views Left  Result Date: 08/15/2021 CLINICAL DATA:  Knee pain EXAM: LEFT KNEE - 1-2 VIEW COMPARISON:  None. FINDINGS: No evidence of fracture, dislocation, or joint effusion. No evidence of arthropathy or other focal bone abnormality. Soft tissues are unremarkable. IMPRESSION: Negative. Electronically Signed   By: Rolm Baptise M.D.   On: 08/15/2021 23:23   CT Head Wo Contrast  Result Date: 08/16/2021 CLINICAL DATA:  Neuro deficit, acute, stroke suspected. Two months of left lower extremity numbness and weakness. EXAM: CT HEAD WITHOUT CONTRAST TECHNIQUE: Contiguous axial images were obtained from the base of the skull through the vertex without intravenous  contrast. RADIATION DOSE REDUCTION: This exam was performed according to the departmental dose-optimization program which includes automated exposure control, adjustment of the mA and/or kV according to patient size and/or use of iterative reconstruction technique. COMPARISON:  None. FINDINGS: Brain: No acute intracranial hemorrhage, midline shift or mass effect. No extra-axial fluid collection. Gray-white matter differentiation is within normal limits. No hydrocephalus. Vascular: No hyperdense vessel or unexpected calcification. Skull: Normal. Negative for fracture or focal lesion. Sinuses/Orbits: No acute finding. Other: None. IMPRESSION: No acute intracranial process. Electronically Signed   By: Brett Fairy M.D.   On: 08/16/2021 03:33   MR CERVICAL SPINE WO CONTRAST  Result Date: 08/16/2021 CLINICAL DATA:  Myelopathy, acute, cervical spine cervical versus thoracic myelopathy. Upper extremity numbness EXAM: MRI CERVICAL SPINE WITHOUT CONTRAST TECHNIQUE: Multiplanar, multisequence MR imaging of the cervical spine was performed. No intravenous contrast was administered. COMPARISON:  None. FINDINGS: Alignment: Reversal of the cervical lordosis. Slight retrolisthesis at C4-5 and C5-6. Vertebrae: No acute fracture. No evidence of discitis. Multilevel discogenic endplate marrow changes. No marrow replacing bone lesion. Mild diffuse intrinsic canal narrowing on the basis of congenitally short pedicles. Cord: Focally increased T2/STIR signal within the cervical cord at the C6-7 level (series 2, image 10). Cord is slightly diminutive in caliber at this location likely reflecting chronic myelomalacia. No sites of cord enlargement. No additional sites of cervical cord signal abnormality. Posterior Fossa, vertebral arteries, paraspinal tissues: Negative. Disc levels: C2-C3: Left paracentral disc osteophyte complex contacts the left hemicord resulting in mild canal stenosis. Mild left foraminal stenosis. C3-C4: Disc  osteophyte complex, slightly eccentric to the left with left greater than right uncovertebral spurring. Findings result in mild-to-moderate canal stenosis with mild-moderate left and mild right foraminal stenosis. C4-C5: Disc osteophyte complex with bilateral uncovertebral spurring resulting in moderate canal stenosis with moderate bilateral foraminal stenosis. C5-C6: Disc osteophyte complex, eccentric to the left with uncovertebral spurring resulting in moderate canal stenosis with severe left and mild-moderate right foraminal stenosis. C6-C7: Disc osteophyte complex, eccentric to the left with left greater than right uncovertebral spurring resulting in severe canal stenosis with severe left and mild-moderate right foraminal stenosis. C7-T1: No disc protrusion. Left-sided uncovertebral spurring contributes to mild-moderate left-sided foraminal stenosis. No canal stenosis. IMPRESSION: 1. 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 2. Focally increased T2/STIR signal within the cervical cord at the C6-7 level, suggesting compressive myelomalacia, likely chronic. 3. Severe left foraminal stenosis at C5-6 and C6-7. 4. Moderate canal stenosis at C4-5 and C5-6. Electronically Signed   By: Davina Poke D.O.   On: 08/16/2021 14:02   MR THORACIC SPINE WO CONTRAST  Result  Date: 08/16/2021 CLINICAL DATA:  Myelopathy, acute, thoracic spine versus cervical spine EXAM: MRI THORACIC SPINE WITHOUT CONTRAST TECHNIQUE: Multiplanar, multisequence MR imaging of the thoracic spine was performed. No intravenous contrast was administered. COMPARISON:  None. FINDINGS: Alignment: Mild scoliotic curvature convex to the left in the upper thoracic region. Vertebrae: No fracture or focal bone lesion. Cord:  No cord compression or focal cord lesion. Paraspinal and other soft tissues: Negative Disc levels: Disc levels are normal except for  minimal non-compressive disc bulges at T2-3, T3-4 and T4-5. There is mild hypertrophic change of the facet joints at T2-3, T3-4 and T4-5, but there is no compressive narrowing of the canal. There is mild foraminal narrowing on the right at T3-4 and T4-5 and on the left at T2-3 and T3-4. IMPRESSION: No fracture or focal lesion. No central canal stenosis. No cord compression or focal cord lesion. Minimal non-compressive disc bulges at T2-3, T3-4 and T4-5. Upper thoracic facet osteoarthritis with mild foraminal narrowing on the right at T3-4 and T4-5 and on the left at T2-3 and T3-4. Often, this level of abnormality is subclinical. Electronically Signed   By: Nelson Chimes M.D.   On: 08/16/2021 13:57   MR LUMBAR SPINE WO CONTRAST  Result Date: 08/16/2021 CLINICAL DATA:  Left lower extremity weakness and numbness EXAM: MRI LUMBAR SPINE WITHOUT CONTRAST TECHNIQUE: Multiplanar, multisequence MR imaging of the lumbar spine was performed. No intravenous contrast was administered. COMPARISON:  None. FINDINGS: Segmentation: Standard; the lowest formed disc space is designated L5-S1 Alignment:  Normal. Vertebrae: Vertebral body heights are preserved. There is minimal degenerative endplate marrow signal abnormality at L4-L5. There is no suspicious marrow signal abnormality. Conus medullaris and cauda equina: Conus extends to the L1-L2 level. Conus and cauda equina appear normal. Paraspinal and other soft tissues: There is edema within the interspinous space at L4-L5. The paraspinal soft tissues are otherwise unremarkable. Disc levels: There is disc desiccation and mild narrowing at L4-L5. The other disc spaces are overall preserved. There is mild multilevel facet arthropathy, most advanced at L5-S1. T12-L1: No significant spinal canal or neural foraminal stenosis. L1-L2: No significant spinal canal or neural foraminal stenosis. L2-L3: No significant spinal canal or neural foraminal stenosis. L3-L4: No significant spinal  canal or neural foraminal stenosis. L4-L5: There is a diffuse disc bulge with a superimposed central protrusion and mild bilateral facet arthropathy resulting in moderate spinal canal stenosis with effacement of the subarticular zones, left worse than right, with probable impingement of the traversing left L5 nerve root, and mild bilateral neural foraminal stenosis. L5-S1: There is left worse than right facet arthropathy without significant spinal canal or neural foraminal stenosis. IMPRESSION: 1. Diffuse disc bulge with a superimposed central protrusion at L4-L5 resulting in moderate spinal canal stenosis with effacement of the subarticular zones and probable impingement of the traversing left L5 nerve root, and mild bilateral neural foraminal stenosis. 2. Minimal degenerative changes at the remaining levels without other significant spinal canal or neural foraminal stenosis. 3. 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 Electronically Signed   By: Valetta Mole M.D.   On: 08/16/2021 08:15    Procedures .Critical Care Performed by: Varney Biles, MD Authorized by: Varney Biles, MD   Critical care provider statement:    Critical care time (minutes):  40   Critical care was necessary to treat or prevent imminent or life-threatening deterioration of the following conditions:  CNS failure or compromise   Critical care was time  spent personally by me on the following activities:  Development of treatment plan with patient or surrogate, discussions with consultants, evaluation of patient's response to treatment, examination of patient, ordering and review of laboratory studies, ordering and review of radiographic studies, ordering and performing treatments and interventions, pulse oximetry, re-evaluation of patient's condition and review of old charts    Medications Ordered in ED Medications  sodium chloride flush (NS) 0.9 % injection 3 mL (3 mLs Intravenous Given  08/16/21 2124)  sodium chloride flush (NS) 0.9 % injection 3 mL (has no administration in time range)  0.9 %  sodium chloride infusion (has no administration in time range)  acetaminophen (TYLENOL) tablet 650 mg (has no administration in time range)    Or  acetaminophen (TYLENOL) suppository 650 mg (has no administration in time range)  oxyCODONE (Oxy IR/ROXICODONE) immediate release tablet 5 mg (5 mg Oral Given 08/16/21 2318)  oxyCODONE (Oxy IR/ROXICODONE) immediate release tablet 10 mg (has no administration in time range)  HYDROmorphone (DILAUDID) injection 1 mg (1 mg Intravenous Given 08/16/21 1232)  cyclobenzaprine (FLEXERIL) tablet 10 mg (10 mg Oral Given 08/16/21 2318)  docusate sodium (COLACE) capsule 100 mg (100 mg Oral Given 08/16/21 2124)  polyethylene glycol (MIRALAX / GLYCOLAX) packet 17 g (has no administration in time range)  ondansetron (ZOFRAN) tablet 4 mg (has no administration in time range)    Or  ondansetron (ZOFRAN) injection 4 mg (has no administration in time range)  menthol-cetylpyridinium (CEPACOL) lozenge 3 mg (has no administration in time range)    Or  phenol (CHLORASEPTIC) mouth spray 1 spray (has no administration in time range)  ceFAZolin (ANCEF) IVPB 2g/100 mL premix (has no administration in time range)  0.9 %  sodium chloride infusion ( Intravenous New Bag/Given 08/16/21 2324)    ED Course/ Medical Decision Making/ A&P                           Medical Decision Making Problems Addressed: Gait abnormality: acute illness or injury that poses a threat to life or bodily functions Numbness and tingling of left leg: acute illness or injury that poses a threat to life or bodily functions  Amount and/or Complexity of Data Reviewed Labs: ordered. Decision-making details documented in ED Course. Radiology: ordered. Decision-making details documented in ED Course. Discussion of management or test interpretation with external provider(s): Did discuss this case with  Dr. Kathrynn Speed, neurology who agrees with the plan of getting MRI lumbar spine prior to discharge with recommendation for outpatient neurology follow-up  Risk Decision regarding hospitalization. Emergency major surgery.  46 year old comes in with chief complaint of left lower extremity weakness and numbness.  Symptoms now present for about 2 months.  He has history of heavy smoking, alcohol use and also used to use periodic cocaine few years back.  No recent injuries.  He does complain of some chronic lower back pain, but he has never had symptoms like this before and the pain is never radiated down his leg.  Symptoms are present for 2 months, therefore subacute.  Differential diagnosis would include stroke, severe stenosis over the L-spine, arterial thrombosis.  Other possibilities considered, but deemed less likely include myelopathy, dissection.  Patient does not have any pain or claudication, there is no critical limb ischemia -could possibly have more proximal aortic thrombosis, but symptoms would have been expected to be bilateral if that was the case.  No evidence of AAA on exam.  Our plan  is to get basic labs, CT scan of the brain and MRI of the lumbar spine.  If the work-up is negative, then patient will be advised to follow-up with neurology for EMG..    Final Clinical Impression(s) / ED Diagnoses Final diagnoses:  Numbness and tingling of left leg  Gait abnormality    Rx / DC Orders ED Discharge Orders     None         Varney Biles, MD 08/17/21 Cindie Laroche    Varney Biles, MD 08/17/21 [id number]

## 2021-08-17 ENCOUNTER — Observation Stay (HOSPITAL_COMMUNITY): Payer: Self-pay

## 2021-08-17 ENCOUNTER — Encounter (HOSPITAL_COMMUNITY)
Admission: EM | Disposition: A | Discharge status: Home / Self Care | Payer: Self-pay | Source: Home / Self Care | Attending: Neurological Surgery

## 2021-08-17 ENCOUNTER — Other Ambulatory Visit: Payer: Self-pay

## 2021-08-17 ENCOUNTER — Observation Stay (HOSPITAL_COMMUNITY): Payer: Self-pay | Admitting: Certified Registered"

## 2021-08-17 ENCOUNTER — Encounter (HOSPITAL_COMMUNITY): Payer: Self-pay | Admitting: Neurological Surgery

## 2021-08-17 HISTORY — PX: ANTERIOR CERVICAL DECOMP/DISCECTOMY FUSION: SHX1161

## 2021-08-17 IMAGING — RF DG CERVICAL SPINE 1V
1 series · 2 of 2 positions shown · non-contrast
Comparison: MR cervical spine done on [DATE]

CLINICAL DATA: Neck pain, cervical disc fusion

EXAM:
DG CERVICAL SPINE - 1 VIEW

[Series 1: run · 2 of 2 slices shown]
[im 1/2]
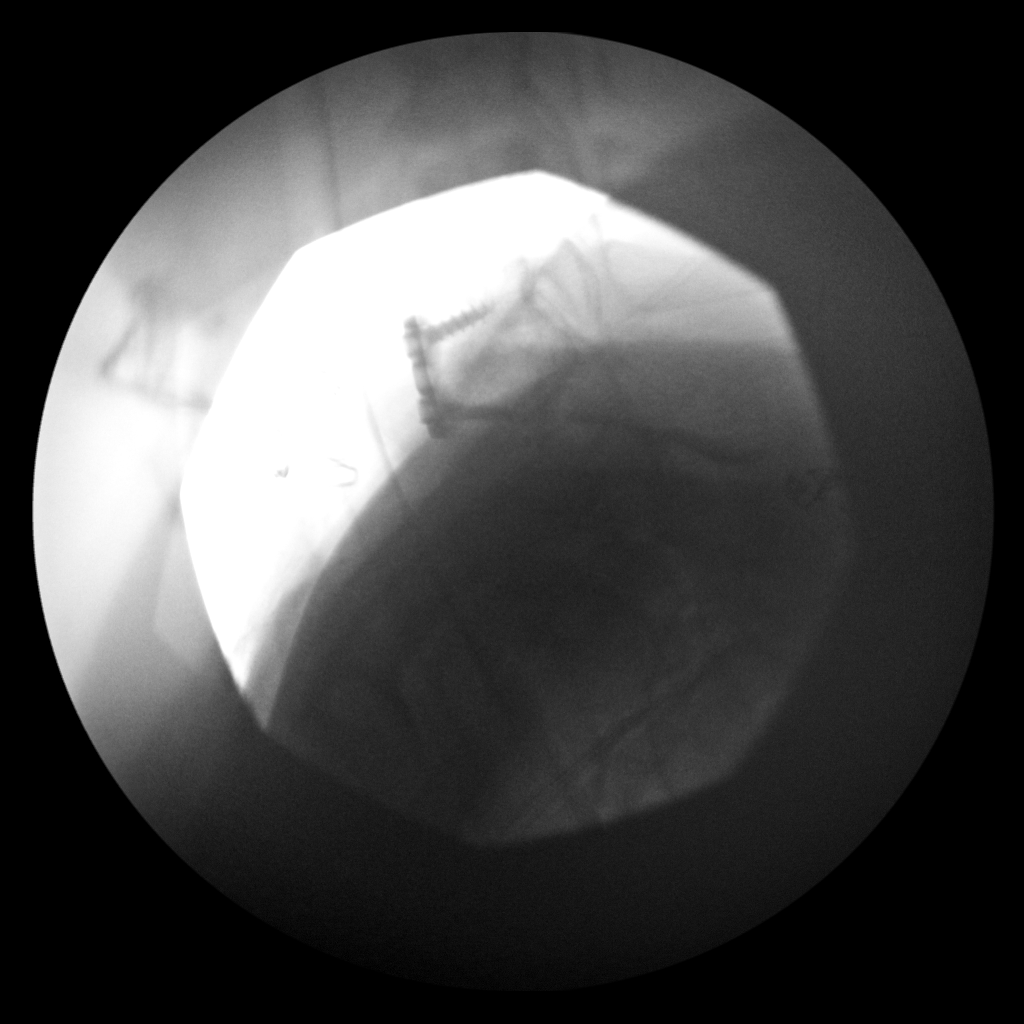
[im 2/2]
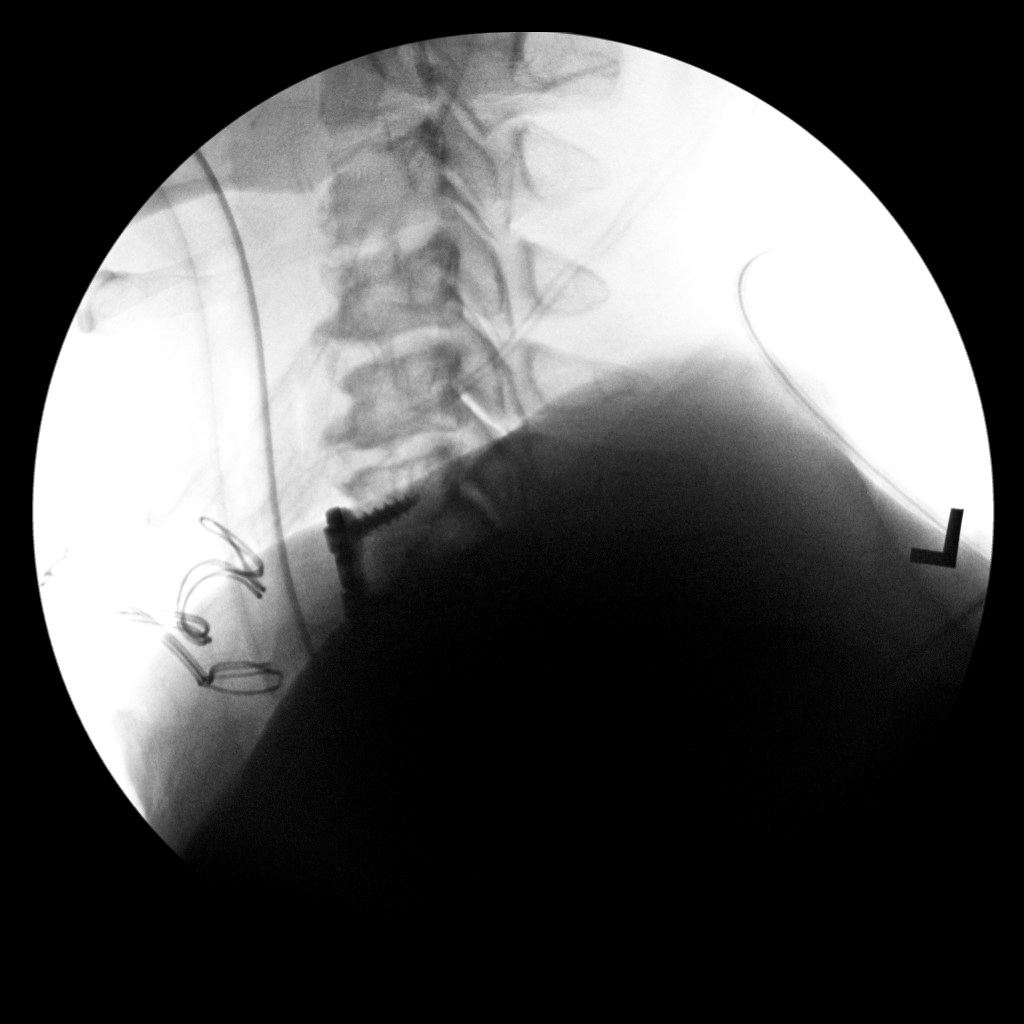

[2 of 2 positions shown; findings below may reference images not displayed]

FINDINGS: Fluoroscopic images show anterior cervical fusion at C6-C7 level.
Degenerative changes are noted with bony spurs at multiple levels.
Fluoroscopic time was 4 seconds. Radiation dose is 2.24 mGy.
IMPRESSION: Fluoroscopic assistance was provided for anterior cervical fusion at
C6-C7 level.

## 2021-08-17 SURGERY — ANTERIOR CERVICAL DECOMPRESSION/DISCECTOMY FUSION 1 LEVEL
Anesthesia: General | Site: Neck

## 2021-08-17 MED ORDER — ROCURONIUM BROMIDE 10 MG/ML (PF) SYRINGE
PREFILLED_SYRINGE | INTRAVENOUS | Status: AC
  Filled 2021-08-17: qty 20

## 2021-08-17 MED ORDER — ACETAMINOPHEN 10 MG/ML IV SOLN
INTRAVENOUS | Status: DC | PRN
  Administered 2021-08-17: 1000 mg via INTRAVENOUS

## 2021-08-17 MED ORDER — THROMBIN 5000 UNITS EX SOLR
CUTANEOUS | Status: AC
  Filled 2021-08-17: qty 5000

## 2021-08-17 MED ORDER — LIDOCAINE-EPINEPHRINE 1 %-1:100000 IJ SOLN
INTRAMUSCULAR | Status: AC
  Filled 2021-08-17: qty 1

## 2021-08-17 MED ORDER — ROCURONIUM BROMIDE 10 MG/ML (PF) SYRINGE
PREFILLED_SYRINGE | INTRAVENOUS | Status: DC | PRN
  Administered 2021-08-17: 70 mg via INTRAVENOUS

## 2021-08-17 MED ORDER — PROPOFOL 10 MG/ML IV BOLUS
INTRAVENOUS | Status: AC
  Filled 2021-08-17: qty 20

## 2021-08-17 MED ORDER — 0.9 % SODIUM CHLORIDE (POUR BTL) OPTIME
TOPICAL | Status: DC | PRN
  Administered 2021-08-17: 11:00:00 1000 mL

## 2021-08-17 MED ORDER — SUGAMMADEX SODIUM 200 MG/2ML IV SOLN
INTRAVENOUS | Status: DC | PRN
  Administered 2021-08-17: 200 mg via INTRAVENOUS

## 2021-08-17 MED ORDER — ORAL CARE MOUTH RINSE: 15.0000 mL | Freq: Once | OROMUCOSAL | Status: AC

## 2021-08-17 MED ORDER — LACTATED RINGERS IV SOLN: INTRAVENOUS | Status: DC

## 2021-08-17 MED ORDER — ONDANSETRON HCL 4 MG/2ML IJ SOLN
INTRAMUSCULAR | Status: AC
  Filled 2021-08-17: qty 4

## 2021-08-17 MED ORDER — CHLORHEXIDINE GLUCONATE 0.12 % MT SOLN
OROMUCOSAL | Status: AC
  Administered 2021-08-17: 10:00:00 15 mL via OROMUCOSAL
  Filled 2021-08-17: qty 15

## 2021-08-17 MED ORDER — LIDOCAINE 2% (20 MG/ML) 5 ML SYRINGE
INTRAMUSCULAR | Status: AC
  Filled 2021-08-17: qty 10

## 2021-08-17 MED ORDER — LIDOCAINE 2% (20 MG/ML) 5 ML SYRINGE
INTRAMUSCULAR | Status: DC | PRN
Start: 2021-08-17 — End: 2021-08-17
  Administered 2021-08-17: 60 mg via INTRAVENOUS

## 2021-08-17 MED ORDER — ACETAMINOPHEN 10 MG/ML IV SOLN
INTRAVENOUS | Status: AC
  Filled 2021-08-17: qty 100

## 2021-08-17 MED ORDER — PHENYLEPHRINE 40 MCG/ML (10ML) SYRINGE FOR IV PUSH (FOR BLOOD PRESSURE SUPPORT)
PREFILLED_SYRINGE | INTRAVENOUS | Status: AC
  Filled 2021-08-17: qty 10

## 2021-08-17 MED ORDER — PROPOFOL 10 MG/ML IV BOLUS
INTRAVENOUS | Status: DC | PRN
  Administered 2021-08-17: 150 mg via INTRAVENOUS

## 2021-08-17 MED ORDER — CHLORHEXIDINE GLUCONATE 0.12 % MT SOLN: 15.0000 mL | Freq: Once | OROMUCOSAL | Status: AC

## 2021-08-17 MED ORDER — DEXAMETHASONE SODIUM PHOSPHATE 10 MG/ML IJ SOLN
INTRAMUSCULAR | Status: AC
  Filled 2021-08-17: qty 1

## 2021-08-17 MED ORDER — MIDAZOLAM HCL 2 MG/2ML IJ SOLN
INTRAMUSCULAR | Status: AC
  Filled 2021-08-17: qty 2

## 2021-08-17 MED ORDER — MIDAZOLAM HCL 2 MG/2ML IJ SOLN
INTRAMUSCULAR | Status: DC | PRN
  Administered 2021-08-17: 2 mg via INTRAVENOUS

## 2021-08-17 MED ORDER — ONDANSETRON HCL 4 MG/2ML IJ SOLN
INTRAMUSCULAR | Status: DC | PRN
  Administered 2021-08-17: 4 mg via INTRAVENOUS

## 2021-08-17 MED ORDER — HYDROMORPHONE HCL 1 MG/ML IJ SOLN: 0.2500 mg | INTRAMUSCULAR | Status: DC | PRN

## 2021-08-17 MED ORDER — FENTANYL CITRATE (PF) 250 MCG/5ML IJ SOLN
INTRAMUSCULAR | Status: AC
  Filled 2021-08-17: qty 5

## 2021-08-17 MED ORDER — FENTANYL CITRATE (PF) 250 MCG/5ML IJ SOLN
INTRAMUSCULAR | Status: DC | PRN
  Administered 2021-08-17: 100 ug via INTRAVENOUS
  Administered 2021-08-17: 50 ug via INTRAVENOUS

## 2021-08-17 MED ORDER — THROMBIN 5000 UNITS EX SOLR: OROMUCOSAL | Status: DC | PRN

## 2021-08-17 MED ORDER — DEXAMETHASONE SODIUM PHOSPHATE 10 MG/ML IJ SOLN
INTRAMUSCULAR | Status: DC | PRN
  Administered 2021-08-17: 10 mg via INTRAVENOUS

## 2021-08-17 MED ORDER — LIDOCAINE-EPINEPHRINE 1 %-1:100000 IJ SOLN
INTRAMUSCULAR | Status: DC | PRN
  Administered 2021-08-17: 10 mL

## 2021-08-17 SURGICAL SUPPLY — 55 items
ADH SKN CLS APL DERMABOND .7 (GAUZE/BANDAGES/DRESSINGS) ×1
APL SKNCLS STERI-STRIP NONHPOA (GAUZE/BANDAGES/DRESSINGS)
BAG COUNTER SPONGE SURGICOUNT (BAG) ×3 IMPLANT
BAG SPNG CNTER NS LX DISP (BAG) ×1
BAND INSRT 18 STRL LF DISP RB (MISCELLANEOUS) ×2
BAND RUBBER #18 3X1/16 STRL (MISCELLANEOUS) ×6 IMPLANT
BENZOIN TINCTURE PRP APPL 2/3 (GAUZE/BANDAGES/DRESSINGS) IMPLANT
BLADE CLIPPER SURG (BLADE) IMPLANT
BLADE SURG 11 STRL SS (BLADE) ×3 IMPLANT
BUR MATCHSTICK NEURO 3.0 LAGG (BURR) ×3 IMPLANT
CANISTER SUCT 3000ML PPV (MISCELLANEOUS) ×3 IMPLANT
DECANTER SPIKE VIAL GLASS SM (MISCELLANEOUS) ×3 IMPLANT
DERMABOND ADVANCED (GAUZE/BANDAGES/DRESSINGS) ×1
DERMABOND ADVANCED .7 DNX12 (GAUZE/BANDAGES/DRESSINGS) ×2 IMPLANT
DRAPE C-ARM 42X72 X-RAY (DRAPES) ×6 IMPLANT
DRAPE HALF SHEET 40X57 (DRAPES) IMPLANT
DRAPE LAPAROTOMY 100X72 PEDS (DRAPES) ×3 IMPLANT
DRAPE MICROSCOPE LEICA (MISCELLANEOUS) ×3 IMPLANT
DURAPREP 6ML APPLICATOR 50/CS (WOUND CARE) ×3 IMPLANT
ELECT COATED BLADE 2.86 ST (ELECTRODE) ×3 IMPLANT
ELECT REM PT RETURN 9FT ADLT (ELECTROSURGICAL) ×2
ELECTRODE REM PT RTRN 9FT ADLT (ELECTROSURGICAL) ×2 IMPLANT
GAUZE 4X4 16PLY ~~LOC~~+RFID DBL (SPONGE) IMPLANT
GLOVE EXAM NITRILE LRG STRL (GLOVE) IMPLANT
GLOVE EXAM NITRILE XL STR (GLOVE) IMPLANT
GLOVE EXAM NITRILE XS STR PU (GLOVE) IMPLANT
GLOVE SURG LTX SZ7.5 (GLOVE) ×3 IMPLANT
GLOVE SURG UNDER POLY LF SZ7.5 (GLOVE) ×6 IMPLANT
GOWN STRL REUS W/ TWL LRG LVL3 (GOWN DISPOSABLE) ×4 IMPLANT
GOWN STRL REUS W/ TWL XL LVL3 (GOWN DISPOSABLE) IMPLANT
GOWN STRL REUS W/TWL 2XL LVL3 (GOWN DISPOSABLE) IMPLANT
GOWN STRL REUS W/TWL LRG LVL3 (GOWN DISPOSABLE) ×4
GOWN STRL REUS W/TWL XL LVL3 (GOWN DISPOSABLE)
HEMOSTAT POWDER KIT SURGIFOAM (HEMOSTASIS) ×3 IMPLANT
KIT BASIN OR (CUSTOM PROCEDURE TRAY) ×3 IMPLANT
KIT TURNOVER KIT B (KITS) ×3 IMPLANT
NDL SPNL 18GX3.5 QUINCKE PK (NEEDLE) ×2 IMPLANT
NEEDLE HYPO 22GX1.5 SAFETY (NEEDLE) ×3 IMPLANT
NEEDLE SPNL 18GX3.5 QUINCKE PK (NEEDLE) ×2 IMPLANT
NS IRRIG 1000ML POUR BTL (IV SOLUTION) ×3 IMPLANT
PACK LAMINECTOMY NEURO (CUSTOM PROCEDURE TRAY) ×3 IMPLANT
PAD ARMBOARD 7.5X6 YLW CONV (MISCELLANEOUS) ×9 IMPLANT
PIN DISTRACTION 14MM (PIN) IMPLANT
PLATE ELITE 21MM (Plate) ×1 IMPLANT
SCREW SPINAL 4.0X14MM TITANIUM (Screw) ×4 IMPLANT
SPACER BONE CORNERSTONE 7X14 (Orthopedic Implant) ×1 IMPLANT
SPONGE INTESTINAL PEANUT (DISPOSABLE) ×3 IMPLANT
SPONGE SURGIFOAM ABS GEL SZ50 (HEMOSTASIS) IMPLANT
STAPLER VISISTAT 35W (STAPLE) IMPLANT
SUT MNCRL AB 3-0 PS2 18 (SUTURE) ×3 IMPLANT
SUT VIC AB 3-0 SH 8-18 (SUTURE) ×3 IMPLANT
TAPE CLOTH 3X10 TAN LF (GAUZE/BANDAGES/DRESSINGS) ×3 IMPLANT
TOWEL GREEN STERILE (TOWEL DISPOSABLE) ×3 IMPLANT
TOWEL GREEN STERILE FF (TOWEL DISPOSABLE) ×3 IMPLANT
WATER STERILE IRR 1000ML POUR (IV SOLUTION) ×3 IMPLANT

## 2021-08-17 NOTE — Anesthesia Preprocedure Evaluation (Addendum)
Anesthesia Evaluation  Patient identified by MRN, date of birth, ID band Patient awake    Reviewed: Allergy & Precautions, H&P , NPO status , Patient's Chart, lab work & pertinent test results  Airway Mallampati: I  TM Distance: >3 FB Neck ROM: Full    Dental no notable dental hx. (+) Teeth Intact, Dental Advisory Given   Pulmonary Current Smoker and Patient abstained from smoking.,    Pulmonary exam normal breath sounds clear to auscultation       Cardiovascular negative cardio ROS   Rhythm:Regular Rate:Normal     Neuro/Psych negative neurological ROS  negative psych ROS   GI/Hepatic negative GI ROS, Neg liver ROS,   Endo/Other  negative endocrine ROS  Renal/GU negative Renal ROS  negative genitourinary   Musculoskeletal   Abdominal   Peds  Hematology negative hematology ROS (+)   Anesthesia Other Findings   Reproductive/Obstetrics negative OB ROS                            Anesthesia Physical Anesthesia Plan  ASA: 2  Anesthesia Plan: General   Post-op Pain Management: Ofirmev IV (intra-op)   Induction: Intravenous  PONV Risk Score and Plan: 2 and Ondansetron, Dexamethasone and Midazolam  Airway Management Planned: Oral ETT and Video Laryngoscope Planned  Additional Equipment:   Intra-op Plan:   Post-operative Plan: Extubation in OR  Informed Consent: I have reviewed the patients History and Physical, chart, labs and discussed the procedure including the risks, benefits and alternatives for the proposed anesthesia with the patient or authorized representative who has indicated his/her understanding and acceptance.     Dental advisory given  Plan Discussed with: CRNA  Anesthesia Plan Comments:         Anesthesia Quick Evaluation

## 2021-08-17 NOTE — Progress Notes (Signed)
Caregiver Mr. Adora Fridge, is concerned about patient being able to enter residence once he is discharged due to patient having more than 30 steps to get into apartment.  Peterson Ao is also wanting to be contacted so he can be educated by PT on how to properly care for patient.  Peterson Ao can be reached at [phone].

## 2021-08-17 NOTE — Anesthesia Postprocedure Evaluation (Signed)
Anesthesia Post Note  Patient: Douglas Mclaughlin  Procedure(s) Performed: ANTERIOR CERVICAL DECOMPRESSION/DISCECTOMY FUSION 1 LEVEL -  Cervical six to cervical seven (Neck)     Patient location during evaluation: PACU Anesthesia Type: General Level of consciousness: awake and alert Pain management: pain level controlled Vital Signs Assessment: post-procedure vital signs reviewed and stable Respiratory status: spontaneous breathing, nonlabored ventilation and respiratory function stable Cardiovascular status: blood pressure returned to baseline and stable Postop Assessment: no apparent nausea or vomiting Anesthetic complications: no   No notable events documented.  Last Vitals:  Vitals:   08/17/21 1302 08/17/21 1315  BP: 131/81   Pulse: 77 (!) 55  Resp: 17 13  Temp: 36.4 C   SpO2: 94% 97%    Last Pain:  Vitals:   08/17/21 1302  TempSrc:   PainSc: Asleep                 [name],W. EDMOND

## 2021-08-17 NOTE — Anesthesia Procedure Notes (Signed)
Procedure Name: Intubation Date/Time: 08/17/2021 10:37 AM Performed by: Lanell Matar, CRNA Pre-anesthesia Checklist: Patient identified, Emergency Drugs available, Suction available and Patient being monitored Patient Re-evaluated:Patient Re-evaluated prior to induction Oxygen Delivery Method: Circle System Utilized Preoxygenation: Pre-oxygenation with 100% oxygen Induction Type: IV induction Ventilation: Mask ventilation without difficulty Laryngoscope Size: Glidescope and 3 Grade View: Grade I Tube type: Oral Tube size: 7.5 mm Number of attempts: 1 Airway Equipment and Method: Stylet and Oral airway Placement Confirmation: ETT inserted through vocal cords under direct vision, positive ETCO2 and breath sounds checked- equal and bilateral Secured at: 23 cm Tube secured with: Tape Dental Injury: Teeth and Oropharynx as per pre-operative assessment

## 2021-08-17 NOTE — Progress Notes (Signed)
Neurosurgery Service Progress Note  Subjective: No acute events overnight, no new complaints   Objective: Vitals:   08/17/21 0429 08/17/21 0445 08/17/21 0757 08/17/21 0914  BP: 96/66 125/81 (!) 97/57 (!) 123/55  Pulse: (!) 47 (!) 49 (!) 51 (!) 47  Resp: 17 16 18 18   Temp: 98.2 F (36.8 C) 98.2 F (36.8 C) 99.3 F (37.4 C) 97.6 F (36.4 C)  TempSrc: Oral Oral Oral Oral  SpO2: 100% 98% 96% 97%  Weight:      Height:        Physical Exam: Strength 5/5 x4 except left PF/DF/EHL 4/5, proximal strength exam at least 4 to 4+/5 but significantly increased tone bilaterally that limits strength testing SILT except fairly dense and consistent numbness throughout the LLE from toes to the inguinal ligament, less severe on the right, and involving the entire bilateral hands, again more left than right Tone normal in BUE but inc'd reflexes at L biceps > R biceps Inc'd tone in BLE with ankle clonus, impressive hyper-reflexia at the achilles/patella bilaterally  Assessment & Plan: 46 y.o. man w/ cervical myelopathy, MRI w/ stenosis and cord signal change at C6-7.  -OR today for ACDF  59  08/17/21 9:52 AM

## 2021-08-17 NOTE — Op Note (Signed)
PATIENT: Douglas Mclaughlin  PROCEDURE DATE: 08/17/21  PRE-OPERATIVE DIAGNOSIS:  Cervical myelopathy   POST-OPERATIVE DIAGNOSIS:  Same   PROCEDURE:  C6-C7 Anterior Cervical Discectomy and Instrumented Fusion   SURGEON:  Surgeon(s) and Role:    Jadene Pierini, MD - Primary   ANESTHESIA: ETGA   BRIEF HISTORY: This is a 46 year old man who presented with bilateral lower greater than upper extremity numbness with weakness and unsteady gait. Workup showed severe canal stenosis at C6-7 with cord signal change. I therefore recommended a C6-7 ACDF to decompress his cord at that level. This was discussed with the patient as well as risks, benefits, and alternatives and the patient wished to proceed with surgical treatment.   OPERATIVE DETAIL: The patient was taken to the operating room and placed on the OR table in the supine position. A formal time out was performed with two patient identifiers and confirmed the operative site. Anesthesia was induced by the anesthesia team.  Fluoroscopy was used to localize the surgical level and an incision was marked in a skin crease. The area was then prepped and draped in a sterile fashion. A transverse linear incision was made on the right side of the neck. The platysma was divided and the sternocleidomastoid muscle was identified. The carotid sheath was palpated, identified, and retracted laterally with the sternocleidomastoid muscle. The strap muscles were identified and retracted medially and the pretracheal fascia was entered. A bent spinal needle was used with fluoroscopy to localize the surgical level after dissection. The longus colli were elevated bilaterally and a self-retaining retractor was placed. The endotracheal tube cuff balloon was deflated and reinflated after retractor placement.   Anterior osteophytes were removed until flush with the anterior vertebral body. The disc annulus was incised and a complete C6-C7 discectomy was performed. The posterior  longitudinal ligament was incised followed by ligamentous and bony removal until no central canal stenosis was present. Decompression was then taken out laterally into the bilateral foramina until no foraminal stenosis was palpable. A 35mm cortical allograft ([employer]) was inserted into the disc space as an interbody graft. An anterior plate ([employer]) was positioned and 4, 14mm screws were used to secure the plate to the C6 and C7 vertebral bodies. Hemostasis was obtained and the incision was closed in layers. All instrument and sponge counts were correct. The patient was then returned to anesthesia for emergence. No apparent complications at the completion of the procedure.   EBL:  12mL   DRAINS: none   SPECIMENS: none   Jadene Pierini, MD 08/17/21 10:20 AM

## 2021-08-17 NOTE — Transfer of Care (Signed)
Immediate Anesthesia Transfer of Care Note  Patient: Douglas Mclaughlin  Procedure(s) Performed: ANTERIOR CERVICAL DECOMPRESSION/DISCECTOMY FUSION 1 LEVEL -  Cervical six to cervical seven (Neck)  Patient Location: PACU  Anesthesia Type:General  Level of Consciousness: awake, drowsy and patient cooperative  Airway & Oxygen Therapy: Patient Spontanous Breathing and Patient connected to nasal cannula oxygen  Post-op Assessment: Report given to RN, Post -op Vital signs reviewed and stable and Patient moving all extremities X 4  Post vital signs: Reviewed and stable  Last Vitals:  Vitals Value Taken Time  BP 127/93 08/17/21 1235  Temp    Pulse 90 08/17/21 1235  Resp 18 08/17/21 1235  SpO2 96 % 08/17/21 1235  Vitals shown include unvalidated device data.  Last Pain:  Vitals:   08/17/21 0931  TempSrc:   PainSc: 4       Patients Stated Pain Goal: 2 (08/17/21 0931)  Complications: No notable events documented.

## 2021-08-18 ENCOUNTER — Encounter (HOSPITAL_COMMUNITY): Payer: Self-pay | Admitting: Neurological Surgery

## 2021-08-18 MED ORDER — CYCLOBENZAPRINE HCL 10 MG PO TABS: 10.0000 mg | ORAL_TABLET | Freq: Three times a day (TID) | ORAL | 0 refills | Status: DC | PRN

## 2021-08-18 MED ORDER — OXYCODONE HCL 5 MG PO TABS: 5.0000 mg | ORAL_TABLET | ORAL | 0 refills | Status: DC | PRN

## 2021-08-18 NOTE — Discharge Summary (Signed)
Discharge Summary  Date of Admission: 08/15/2021  Date of Discharge: 08/18/21  Attending Physician: Autumn Patty, MD  Hospital Course: Patient presented to the ED with progressive bilateral lower greater than upper extremity numbness and weakness. Workup revealed cervical myelopathy 2/2 severe canal stenosis with cord signal change. He was taken to the OR on 08/17/21 for an uncomplicated C6-7 ACDF. They were recovered in PACU and transferred to 4NP. Their preop symptoms were completely resolved, their hospital course was uncomplicated and the patient was discharged home w/ home PT/TO on 08/18/21. They will follow up in clinic with me in clinic in 2 weeks.  Neurologic exam at discharge:  Strength 5/5 x4 except LLE 4+/5  SILTx4 except improving mild numbness LLE > RLE > BUE hyperreflexia x4 with +bilateral hoffman's and ankle clonus  Discharge diagnosis: Cervical myelopathy  Jadene Pierini, MD 08/18/21 9:07 AM

## 2021-08-18 NOTE — Progress Notes (Signed)
Neurosurgery Service Progress Note  Subjective: No acute events overnight, no dysphagia, mild to moderate neck pain, arms and legs dramatically better today, he's very pleased, numbness improved in BLE significantly   Objective: Vitals:   08/17/21 1908 08/17/21 2303 08/18/21 0329 08/18/21 0744  BP: 124/83 117/63 125/78 (!) 123/54  Pulse: 60 69 60 62  Resp:  17 18 18   Temp: 98.2 F (36.8 C) 99.1 F (37.3 C) 99.8 F (37.7 C) 98.4 F (36.9 C)  TempSrc: Oral Oral Oral Oral  SpO2: 97% 98% 99% 96%  Weight:      Height:        Physical Exam: Strength 5/5 x4 except LLE PF/DF/EHL 4+/5, SILTx4, Tone normal in BUE but inc'd reflexes at L biceps > R biceps Inc'd tone in BLE with ankle clonus, impressive hyper-reflexia at the achilles/patella bilaterally  Assessment & Plan: 46 y.o. man w/ progressive cervical myelopathy s/p C6-7 ACDF with improvement in symptoms and improving exam.   -discharge home today w/ home PT/OT  Judith Part  08/18/21 9:03 AM

## 2021-08-18 NOTE — Progress Notes (Signed)
Pt with discharge orders, discharge paperwork reviewed with patient and all questions answered. IV's removed. Pt taken down via wheelchair with all belongings.   °

## 2021-08-18 NOTE — TOC Initial Note (Addendum)
Transition of Care [facility]) - Initial/Assessment Note    Patient Details  Name: Douglas Mclaughlin MRN: [id number] Date of Birth: Mar 19, 1976  [facility] CM/SW Contact:    Beckie Busing, RN Phone Number:[phone]  08/18/2021, 10:14 AM  Clinical Narrative:                 TOC consulted for Home health needs. Patient states that he is from home alone where he has friends that can check on him. Cm offered patient HH through [facility] for uninsured patients. Patient states that he does work although he will be out for about 4 weeks. Patient states that he really just wants printouts and instruction on how to do his own therapy at home. Does not want home health. States "I can do my own therapy". Message has been sent to Claudette Stapler with PT to provide patient with handouts.   64 Wendy with PT is agreeable to give patient handouts. CM will follow up with patient after he receives handouts.  1052 Patient has discharged due to nurse reports that his ride was here and he wanted to leave.   Expected Discharge Plan: Home/Self Care Barriers to Discharge: No Barriers Identified   Patient Goals and CMS Choice Patient states their goals for this hospitalization and ongoing recovery are:: Ready to go home   Choice offered to / list presented to : NA [facility])  Expected Discharge Plan and Services Expected Discharge Plan: Home/Self Care In-house Referral: NA Discharge Planning Services: CM Consult Post Acute Care Choice: NA Living arrangements for the past 2 months: Apartment Expected Discharge Date: 08/18/21               DME Arranged: N/A DME Agency: NA                  Prior Living Arrangements/Services Living arrangements for the past 2 months: Apartment Lives with:: Self Patient language and need for interpreter reviewed:: Yes Do you feel safe going back to the place where you live?: Yes      Need for Family Participation in Patient Care: Yes (Comment) Care giver support  system in place?: Yes (comment)   Criminal Activity/Legal Involvement Pertinent to Current Situation/Hospitalization: No - Comment as needed  Activities of Daily Living Home Assistive Devices/Equipment: None ADL Screening (condition at time of admission) Patient's cognitive ability adequate to safely complete daily activities?: Yes Is the patient deaf or have difficulty hearing?: No Does the patient have difficulty seeing, even when wearing glasses/contacts?: No Does the patient have difficulty concentrating, remembering, or making decisions?: No Patient able to express need for assistance with ADLs?: Yes Does the patient have difficulty dressing or bathing?: Yes Independently performs ADLs?: Yes (appropriate for developmental age) Does the patient have difficulty walking or climbing stairs?: Yes Weakness of Legs: Left Weakness of Arms/Hands: Left  Permission Sought/Granted   Permission granted to share information with : No              Emotional Assessment Appearance:: Appears stated age Attitude/Demeanor/Rapport: Gracious Affect (typically observed): Accepting, Pleasant Orientation: : Oriented to Self, Oriented to Place, Oriented to  Time, Oriented to Situation Alcohol / Substance Use: Not Applicable Psych Involvement: No (comment)  Admission diagnosis:  Gait abnormality [R26.9] Cervical myelopathy (HCC) [G95.9] Numbness and tingling of left leg [R20.0, R20.2] Patient Active Problem List   Diagnosis Date Noted   Cervical myelopathy (HCC) 08/16/2021   PCP:  Oneita Hurt, No Pharmacy:   [facility] [phone] -  [place], [place] - [address] AT [facility] [address] [place] [address] Phone: [phone] Fax: [phone]     Social Determinants of Health (SDOH) Interventions    Readmission Risk Interventions No flowsheet data found.

## 2021-08-18 NOTE — Evaluation (Signed)
Occupational Therapy Evaluation Patient Details Name: Douglas Mclaughlin MRN: [id number] DOB: Oct 29, 1975 Today's Date: 08/18/2021   History of Present Illness 46 y.o. male presenting to ED 1/22 with L>R UE/LE numbness and weakness and unsteady gait x2 months. Patient found to have cervical myelopathy s/p C6-7 ACDF on 1/24 by Dr. Marcia Brash. PMHx significant for lumbago, Hx of tobacco use (quit 1 yr ago) and Hx of cocaine use (quit a few years ago).   Clinical Impression   PTA patient was living alone in a 2nd floor apartment with 25-30 STE and was grossly I with ADLs/IADLs. Patient was working nights a Merchandiser, retail at [employer]. Patient currently functioning slightly below baseline demonstrating observed ADLs with supervision A overall for safety and occasional cues for adherence to cervical precautions. Reports that numbness/tingling in LUE/LLE has resolved. AROM and coordination appear WFL. Denies difficulty managing utensils during self-feeding tasks. No difficulty managing cell phone. Patient continues to be limited by decreased ROM, ?proprioception? and strength in LLE although improved post-op. Plan for 1 more follow-up OT visit for education on safety and practice with tub/shower transfers in prep for safe d/c home.      Recommendations for follow up therapy are one component of a multi-disciplinary discharge planning process, led by the attending physician.  Recommendations may be updated based on patient status, additional functional criteria and insurance authorization.   Follow Up Recommendations  No OT follow up    Assistance Recommended at Discharge PRN  Patient can return home with the following Assistance with cooking/housework;Assist for transportation;Help with stairs or ramp for entrance    Functional Status Assessment  Patient has had a recent decline in their functional status and demonstrates the ability to make significant improvements in function in a reasonable and predictable  amount of time.  Equipment Recommendations  None recommended by OT    Recommendations for Other Services       Precautions / Restrictions Precautions Precautions: Cervical Precaution Booklet Issued: Yes (comment) Precaution Comments: Written and verbal education provided. Fair carryover with ADLs. Required Braces or Orthoses:  (No brace needed) Restrictions Weight Bearing Restrictions: No      Mobility Bed Mobility Overal bed mobility: Needs Assistance Bed Mobility: Rolling, Sidelying to Sit Rolling: Supervision Sidelying to sit: Supervision       General bed mobility comments: Supervision with cues for log rolling technique.    Transfers Overall transfer level: Needs assistance Equipment used: None Transfers: Sit to/from Stand Sit to Stand: Supervision           General transfer comment: Supervision A for safety with sit to stand from EOB positioned in lowest setting and from low recliner.      Balance Overall balance assessment: Mild deficits observed, not formally tested (Has a limp that does not severely impact balance.)                                         ADL either performed or assessed with clinical judgement   ADL Overall ADL's : Needs assistance/impaired     Grooming: Supervision/safety;Standing Grooming Details (indicate cue type and reason): 2/3 grooming tasks standing at sink level. Used 2 cups to wrise/spit for adherence to cervical precautions. Upper Body Bathing: Supervision/ safety;Standing   Lower Body Bathing: Supervison/ safety;Sit to/from stand   Upper Body Dressing : Set up;Sitting   Lower Body Dressing: Supervision/safety;Sit to/from stand Lower Body Dressing Details (  indicate cue type and reason): Able to doff/don footwear in figure-4 position without external assist. Toilet Transfer: Supervision/safety Toilet Transfer Details (indicate cue type and reason): To standard height commode in bathroom. Toileting-  Clothing Manipulation and Hygiene: Supervision/safety;Sit to/from stand         General ADL Comments: Supervision to Min guard overall without AD. Fair carryover of cervical precautions.     Vision Baseline Vision/History: 0 No visual deficits Vision Assessment?: No apparent visual deficits     Perception     Praxis      Pertinent Vitals/Pain Pain Assessment Pain Assessment: No/denies pain     Hand Dominance Right   Extremity/Trunk Assessment Upper Extremity Assessment Upper Extremity Assessment: RUE deficits/detail;LUE deficits/detail RUE Deficits / Details: WFL LUE Deficits / Details: AROM WFL (limited shoulder flexion <90 degrees 2/2 cervical precautions) LUE Sensation: WNL (Denies numbness/tingling) LUE Coordination: WNL ([facility]; deficits have resolved)   Lower Extremity Assessment Lower Extremity Assessment: Defer to PT evaluation   Cervical / Trunk Assessment Cervical / Trunk Assessment: Neck Surgery   Communication Communication Communication: No difficulties   Cognition Arousal/Alertness: Awake/alert Behavior During Therapy: WFL for tasks assessed/performed Overall Cognitive Status: Within Functional Limits for tasks assessed                                       General Comments       Exercises     Shoulder Instructions      Home Living Family/patient expects to be discharged to:: Private residence Living Arrangements: Alone Available Help at Discharge: Friend(s);Available PRN/intermittently Type of Home: Apartment Home Access: Stairs to enter Entrance Stairs-Number of Steps: 25-30 Entrance Stairs-Rails: Right;Left Home Layout: One level     Bathroom Shower/Tub: Tub/shower unit;Door   Foot LockerBathroom Toilet: Standard     Home Equipment: None          Prior Functioning/Environment Prior Level of Function : Independent/Modified Independent;Working/employed;Driving                        OT  Problem List: Decreased strength;Impaired balance (sitting and/or standing);Decreased knowledge of precautions      OT Treatment/Interventions: Self-care/ADL training;Therapeutic exercise;Neuromuscular education;Energy conservation;Therapeutic activities;Patient/family education;Balance training    OT Goals(Current goals can be found in the care plan section) Acute Rehab OT Goals Patient Stated Goal: To return home. OT Goal Formulation: With patient Time For Goal Achievement: 09/01/21 Potential to Achieve Goals: Good ADL Goals Additional ADL Goal #1: Patient will complete ADLs with Mod I in prep for safe d/c home. Additional ADL Goal #2: Patient will recall and demo 3/3 cervical precautions in prep for ADLs.  OT Frequency: Other (comment) (1 follow-up OT visit)    Co-evaluation              AM-PAC OT "6 Clicks" Daily Activity     Outcome Measure Help from another person eating meals?: None Help from another person taking care of personal grooming?: A Little Help from another person toileting, which includes using toliet, bedpan, or urinal?: A Little Help from another person bathing (including washing, rinsing, drying)?: A Little Help from another person to put on and taking off regular upper body clothing?: A Little Help from another person to put on and taking off regular lower body clothing?: A Little 6 Click Score: 19   End of Session Equipment Utilized During Treatment: Gait belt Nurse  Communication: Mobility status  Activity Tolerance: Patient tolerated treatment well Patient left: in chair;with call bell/phone within reach;with chair alarm set  OT Visit Diagnosis: Unsteadiness on feet (R26.81);Muscle weakness (generalized) (M62.81)                Time: 6979-4801 OT Time Calculation (min): 22 min Charges:  OT General Charges $OT Visit: 1 Visit OT Evaluation $OT Eval Low Complexity: 1 Low  Mckinlee Dunk H. OTR/L Supplemental OT, Department of rehab services  [phone]  Samanta Gal R H. 08/18/2021, 8:10 AM

## 2021-08-18 NOTE — Evaluation (Addendum)
Physical Therapy Evaluation Patient Details Name: Douglas Mclaughlin MRN: [id number] DOB: [birth year] Today's Date: 08/18/2021  History of Present Illness  46 y.o. male presenting to ED 1/22 with L>R UE/LE numbness and weakness and unsteady gait x2 months. Patient found to have cervical myelopathy s/p C6-7 ACDF on 1/24 by Dr. Joaquim Nam. PMHx significant for lumbago, Hx of tobacco use (quit 1 yr ago) and Hx of cocaine use (quit a few years ago).   Clinical Impression  PT eval complete. Pt is mod I bed mobility and transfers. Supervision ambulation 300' without AD and ascend/descend 14 steps with R rail. Pt presents with gait compensatory methods due to decreased L dorsiflexion. Primarily, steppage gait on L vs L hip circumduction. Verbal cues for heel strike/toe off on LLE. OPPT would be ideal PT follow up, but pt has no assist for transportation during the day (only evenings/early morning). Therefore, recommending HHPT.  Plan is for d/c home today. PT signing off.  Addendum 1028: Pt without insurance and does not qualify for charity follow up services. Pt declining HHPT and prefers HEP. Pt educated on LE exercises and HEP handouts provided. Educated on no strengthening/stretching to cervical/trunk during acute healing phase. Educated on need for neurosurgery clearance before progressing to resistive exercises. He verbalizes understanding.     Recommendations for follow up therapy are one component of a multi-disciplinary discharge planning process, led by the attending physician.  Recommendations may be updated based on patient status, additional functional criteria and insurance authorization.  Follow Up Recommendations Home health PT (due to not having transportation to [place])    Assistance Recommended at Discharge PRN  Patient can return home with the following  Assist for transportation    Equipment Recommendations None recommended by PT  Recommendations for Other Services       Functional  Status Assessment Patient has had a recent decline in their functional status and demonstrates the ability to make significant improvements in function in a reasonable and predictable amount of time.     Precautions / Restrictions Precautions Precautions: Cervical Precaution Booklet Issued: Yes (comment) Precaution Comments: Cervical handout in room, provided by OT. Required Braces or Orthoses:  (No brace needed) Restrictions Weight Bearing Restrictions: No      Mobility  Bed Mobility Overal bed mobility: Modified Independent                  Transfers Overall transfer level: Modified independent Equipment used: None                    Ambulation/Gait Ambulation/Gait assistance: Supervision Gait Distance (Feet): 300 Feet Assistive device: None Gait Pattern/deviations: Step-through pattern, Steppage Gait velocity: mildly decreased Gait velocity interpretation: >2.62 ft/sec, indicative of community ambulatory   General Gait Details: steppage vs circumduction LLE to clear toes during swing phase. Cues for heel strike and toe off on L.  Stairs Stairs: Yes Stairs assistance: Supervision Stair Management: One rail Right, Forwards, Alternating pattern Number of Stairs: 14    Wheelchair Mobility    Modified Rankin (Stroke Patients Only)       Balance Overall balance assessment: Mild deficits observed, not formally tested                                           Pertinent Vitals/Pain Pain Assessment Pain Assessment: No/denies pain    Home Living Family/patient expects to be  discharged to:: Private residence Living Arrangements: Alone Available Help at Discharge: Friend(s);Available PRN/intermittently Type of Home: Apartment Home Access: Stairs to enter Entrance Stairs-Rails: Right Entrance Stairs-Number of Steps: 25-30   Home Layout: One level Home Equipment: None      Prior Function Prior Level of Function :  Independent/Modified Independent;Working/employed;Driving                     Hand Dominance   Dominant Hand: Right    Extremity/Trunk Assessment   Upper Extremity Assessment Upper Extremity Assessment: Defer to OT evaluation RUE Deficits / Details: WFL LUE Deficits / Details: AROM WFL (limited shoulder flexion <90 degrees 2/2 cervical precautions) LUE Sensation: WNL (Denies numbness/tingling) LUE Coordination: WNL (Denies difficulty with [facility]; deficits have resolved)    Lower Extremity Assessment Lower Extremity Assessment: LLE deficits/detail LLE Deficits / Details: 4-/5. Pt reports numbness has improved. Stiffness reported at knee/ankle    Cervical / Trunk Assessment Cervical / Trunk Assessment: Neck Surgery  Communication   Communication: No difficulties  Cognition Arousal/Alertness: Awake/alert Behavior During Therapy: WFL for tasks assessed/performed Overall Cognitive Status: Within Functional Limits for tasks assessed                                          General Comments      Exercises     Assessment/Plan    PT Assessment All further PT needs can be met in the next venue of care  PT Problem List Decreased mobility;Decreased balance;Decreased strength       PT Treatment Interventions      PT Goals (Current goals can be found in the Care Plan section)  Acute Rehab PT Goals Patient Stated Goal: home PT Goal Formulation: All assessment and education complete, DC therapy    Frequency       Co-evaluation               AM-PAC PT "6 Clicks" Mobility  Outcome Measure Help needed turning from your back to your side while in a flat bed without using bedrails?: None Help needed moving from lying on your back to sitting on the side of a flat bed without using bedrails?: None Help needed moving to and from a bed to a chair (including a wheelchair)?: None Help needed standing up from a chair using your arms (e.g., wheelchair or  bedside chair)?: None Help needed to walk in hospital room?: A Little Help needed climbing 3-5 steps with a railing? : A Little 6 Click Score: 22    End of Session Equipment Utilized During Treatment: Gait belt Activity Tolerance: Patient tolerated treatment well Patient left: in chair;with call bell/phone within reach Nurse Communication: Mobility status PT Visit Diagnosis: Difficulty in walking, not elsewhere classified (R26.2);Unsteadiness on feet (R26.81)    Time: 6950-7225 PT Time Calculation (min) (ACUTE ONLY): 14 min   Charges:   PT Evaluation $PT Eval Low Complexity: 1 Low          Lorrin Goodell, PT  Office # [phone] Pager [phone]   Lorriane Shire 08/18/2021, 9:18 AM

## 2023-09-14 ENCOUNTER — Ambulatory Visit (HOSPITAL_COMMUNITY)
Admission: RE | Admit: 2023-09-14 | Discharge: 2023-09-14 | Disposition: A | Discharge status: Home / Self Care | Payer: Self-pay | Source: Ambulatory Visit | Attending: Emergency Medicine | Admitting: Emergency Medicine

## 2023-09-14 ENCOUNTER — Encounter (HOSPITAL_COMMUNITY): Payer: Self-pay

## 2023-09-14 VITALS — BP 127/79 | HR 69 | Temp 99.2°F | Resp 18

## 2023-09-14 DIAGNOSIS — J069 Acute upper respiratory infection, unspecified: Secondary | ICD-10-CM

## 2023-09-14 LAB — POC COVID19/FLU A&B COMBO
Covid Antigen, POC: NEGATIVE
Influenza A Antigen, POC: NEGATIVE
Influenza B Antigen, POC: NEGATIVE

## 2023-09-14 MED ORDER — PROMETHAZINE-DM 6.25-15 MG/5ML PO SYRP
5.0000 mL | ORAL_SOLUTION | Freq: Four times a day (QID) | ORAL | 0 refills | Status: DC | PRN
Start: 2023-09-14 — End: 2024-03-15

## 2023-09-14 NOTE — ED Provider Notes (Signed)
[facility]    CSN: [id number] Arrival date & time: 09/14/23  1814      History   Chief Complaint Chief Complaint  Patient presents with   Cough    Head and body ache - Entered by patient   Headache    HPI Douglas Mclaughlin is a 48 y.o. male.  2-day history of bodyaches, headache, chills, runny nose, dry cough No fevers Has not attempted intervention yet. Has been eating soup and drinking gatorade Possible sick contacts at work  History reviewed. No pertinent past medical history.  Patient Active Problem List   Diagnosis Date Noted   Cervical myelopathy (HCC) 08/16/2021    Past Surgical History:  Procedure Laterality Date   ANTERIOR CERVICAL DECOMP/DISCECTOMY FUSION N/A 08/17/2021   Procedure: ANTERIOR CERVICAL DECOMPRESSION/DISCECTOMY FUSION 1 LEVEL -  Cervical six to cervical seven;  Surgeon: Jadene Pierini, MD;  Location: [facility];  Service: Neurosurgery;  Laterality: N/A;       Home Medications    Prior to Admission medications   Medication Sig Start Date End Date Taking? Authorizing Provider  promethazine-dextromethorphan (PROMETHAZINE-DM) 6.25-15 MG/5ML syrup Take 5 mLs by mouth 4 (four) times daily as needed for cough. 09/14/23  Yes Gao Mitnick, Ray Church    Family History History reviewed. No pertinent family history.  Social History Social History   Tobacco Use   Smoking status: Some Days    Types: Cigars  Substance Use Topics   Alcohol use: Not Currently   Drug use: Not Currently     Allergies   Patient has no known allergies.   Review of Systems Review of Systems As per HPI  Physical Exam Triage Vital Signs ED Triage Vitals  Encounter Vitals Group     BP 09/14/23 1835 127/79     Systolic BP Percentile --      Diastolic BP Percentile --      Pulse Rate 09/14/23 1835 69     Resp 09/14/23 1835 18     Temp 09/14/23 1835 99.2 F (37.3 C)     Temp Source 09/14/23 1835 Oral     SpO2 09/14/23 1835 96 %     Weight --       Height --      Head Circumference --      Peak Flow --      Pain Score 09/14/23 1834 5     Pain Loc --      Pain Education --      Exclude from Growth Chart --    No data found.  Updated Vital Signs BP 127/79 (BP Location: Left Arm)   Pulse 69   Temp 99.2 F (37.3 C) (Oral)   Resp 18   SpO2 96%   Physical Exam Vitals and nursing note reviewed.  Constitutional:      Appearance: He is not ill-appearing.  HENT:     Right Ear: Tympanic membrane and ear canal normal.     Left Ear: Tympanic membrane and ear canal normal.     Nose: No congestion or rhinorrhea.     Mouth/Throat:     Mouth: Mucous membranes are moist.     Pharynx: Oropharynx is clear. No posterior oropharyngeal erythema.  Eyes:     Conjunctiva/sclera: Conjunctivae normal.  Cardiovascular:     Rate and Rhythm: Normal rate and regular rhythm.     Pulses: Normal pulses.     Heart sounds: Normal heart sounds.  Pulmonary:     Effort: Pulmonary  effort is normal.     Breath sounds: Normal breath sounds. No wheezing, rhonchi or rales.  Abdominal:     Palpations: Abdomen is soft.     Tenderness: There is no abdominal tenderness.  Musculoskeletal:     Cervical back: Normal range of motion. No rigidity or tenderness.  Lymphadenopathy:     Cervical: No cervical adenopathy.  Skin:    General: Skin is warm and dry.  Neurological:     Mental Status: He is alert and oriented to person, place, and time.     UC Treatments / Results  Labs (all labs ordered are listed, but only abnormal results are displayed) Labs Reviewed  POC COVID19/FLU A&B COMBO    EKG   Radiology No results found.  Procedures Procedures (including critical care time)  Medications Ordered in UC Medications - No data to display  Initial Impression / Assessment and Plan / UC Course  I have reviewed the triage vital signs and the nursing notes.  Pertinent labs & imaging results that were available during my care of the patient were  reviewed by me and considered in my medical decision making (see chart for details).  Rapid flu A/B and COVID test are negative today Discussed viral etiology, symptomatic and supportive care Sent Promethazine DM Advised on return precautions Work note is provided Patient agrees to plan, no questions  Final Clinical Impressions(s) / UC Diagnoses   Final diagnoses:  Viral URI with cough     Discharge Instructions      Ibuprofen can be alternated with tylenol every 4-6 hours You can try nasal spray like flonase, and benadryl for runny nose/congestion The promethazine DM cough syrup can be used up to 4 times daily. If this medication makes you drowsy, take only once before bed. Drink lots of fluids       ED Prescriptions     Medication Sig Dispense Auth. Provider   promethazine-dextromethorphan (PROMETHAZINE-DM) 6.25-15 MG/5ML syrup Take 5 mLs by mouth 4 (four) times daily as needed for cough. 240 mL Lasaundra Riche, Lurena Joiner, PA-C      PDMP not reviewed this encounter.   Kathrine Haddock 09/14/23 2952

## 2023-09-14 NOTE — ED Triage Notes (Signed)
Pt c/o cough, headaches, body aches, and chills.   Start Date: 09/12/2023  Home Interventions: None

## 2023-09-14 NOTE — Discharge Instructions (Signed)
Ibuprofen can be alternated with tylenol every 4-6 hours You can try nasal spray like flonase, and benadryl for runny nose/congestion The promethazine DM cough syrup can be used up to 4 times daily. If this medication makes you drowsy, take only once before bed. Drink lots of fluids

## 2024-03-15 ENCOUNTER — Encounter (HOSPITAL_COMMUNITY): Payer: Self-pay | Admitting: Emergency Medicine

## 2024-03-15 ENCOUNTER — Ambulatory Visit (HOSPITAL_COMMUNITY): Payer: Self-pay

## 2024-03-15 ENCOUNTER — Ambulatory Visit (HOSPITAL_COMMUNITY)
Admission: EM | Admit: 2024-03-15 | Discharge: 2024-03-15 | Disposition: A | Discharge status: Home / Self Care | Payer: Self-pay | Attending: Emergency Medicine | Admitting: Emergency Medicine

## 2024-03-15 DIAGNOSIS — M5412 Radiculopathy, cervical region: Secondary | ICD-10-CM

## 2024-03-15 MED ORDER — PREDNISONE 20 MG PO TABS: 40.0000 mg | ORAL_TABLET | Freq: Every day | ORAL | 0 refills | Status: AC

## 2024-03-15 MED ORDER — PREDNISONE 20 MG PO TABS
40.0000 mg | ORAL_TABLET | Freq: Every day | ORAL | 0 refills | Status: DC
Start: 2024-03-16 — End: 2024-03-15

## 2024-03-15 NOTE — Discharge Instructions (Addendum)
 Starting tomorrow -- prednisone  2 tablets daily for 5 days This is an anti-inflammatory that may reduce the nerve symptoms  Please follow up with orthopedics

## 2024-03-15 NOTE — ED Triage Notes (Signed)
 Pt reports numbness in left 4th and 5th fingers for 2 weeks. Reports index finger starting to become numb. Denies any injury.

## 2024-03-15 NOTE — ED Provider Notes (Signed)
 [facility]    CSN: [id number] Arrival date & time: 03/15/24  1649     History   Chief Complaint Chief Complaint  Patient presents with   appt 5    HPI Douglas Mclaughlin is a 48 y.o. male.  2 week history of constant numbness in the left hand fingers. Mostly the 4th and 5th fingers, starting in the 3rd. He does feel numbness intermittently come down from the armpit. The 4th and 5th fingers are difficult to move. He does not have pain, only sensation decrease and muscular weakness.  Denies any injury or trauma to the hand, arm, neck  He has history of cervical fusion at C6-C7  History reviewed. No pertinent past medical history.  Patient Active Problem List   Diagnosis Date Noted   Cervical myelopathy (HCC) 08/16/2021    Past Surgical History:  Procedure Laterality Date   ANTERIOR CERVICAL DECOMP/DISCECTOMY FUSION N/A 08/17/2021   Procedure: ANTERIOR CERVICAL DECOMPRESSION/DISCECTOMY FUSION 1 LEVEL -  Cervical six to cervical seven;  Surgeon: Cheryle Debby LABOR, MD;  Location: [facility];  Service: Neurosurgery;  Laterality: N/A;       Home Medications    Prior to Admission medications   Medication Sig Start Date End Date Taking? Authorizing Provider  predniSONE  (DELTASONE ) 20 MG tablet Take 2 tablets (40 mg total) by mouth daily with breakfast for 5 days. 03/16/24 03/21/24  [place], Asberry PA-C    Family History No family history on file.  Social History Social History   Tobacco Use   Smoking status: Some Days    Types: Cigars  Substance Use Topics   Alcohol use: Not Currently   Drug use: Not Currently     Allergies   Patient has no known allergies.   Review of Systems Review of Systems As per HPI  Physical Exam Triage Vital Signs ED Triage Vitals  Encounter Vitals Group     BP 03/15/24 1722 122/81     Girls Systolic BP Percentile --      Girls Diastolic BP Percentile --      Boys Systolic BP Percentile --      Boys Diastolic BP Percentile  --      Pulse Rate 03/15/24 1722 65     Resp 03/15/24 1722 17     Temp 03/15/24 1722 98.4 F (36.9 C)     Temp Source 03/15/24 1722 Oral     SpO2 03/15/24 1722 96 %     Weight --      Height --      Head Circumference --      Peak Flow --      Pain Score 03/15/24 1721 0     Pain Loc --      Pain Education --      Exclude from Growth Chart --    No data found.  Updated Vital Signs BP 122/81 (BP Location: Left Arm)   Pulse 65   Temp 98.4 F (36.9 C) (Oral)   Resp 17   SpO2 96%    Physical Exam Vitals and nursing note reviewed.  Constitutional:      General: He is not in acute distress.    Appearance: Normal appearance.  HENT:     Mouth/Throat:     Pharynx: Oropharynx is clear.  Cardiovascular:     Rate and Rhythm: Normal rate and regular rhythm.     Pulses: Normal pulses.     Heart sounds: Normal heart sounds.  Pulmonary:  Effort: Pulmonary effort is normal.     Breath sounds: Normal breath sounds.  Abdominal:     Palpations: Abdomen is soft.  Musculoskeletal:     Left hand: No swelling or tenderness. Decreased strength. Decreased sensation of the ulnar distribution. Normal capillary refill. Normal pulse.     Comments: Cap refill fingers < 2 seconds. Strong radial pulse  Skin:    General: Skin is warm and dry.     Comments: Skin is warm and dry. No rash, erythema, lesions, swelling.   Neurological:     Mental Status: He is alert and oriented to person, place, and time.     Comments: Strength 4/5 left hand, 5/5 right hand. Decreased sensation on the ulnar aspect of left arm, left hand, and fingers 3-5. Sensation is still present, just decreased. Cannot extend 4th and 5th fingers actively, can flex them.      UC Treatments / Results  Labs (all labs ordered are listed, but only abnormal results are displayed) Labs Reviewed - No data to display  EKG  Radiology No results found.  Procedures Procedures   Medications Ordered in UC Medications - No data  to display  Initial Impression / Assessment and Plan / UC Course  I have reviewed the triage vital signs and the nursing notes.  Pertinent labs & imaging results that were available during my care of the patient were reviewed by me and considered in my medical decision making (see chart for details).  Decreased sensation with numbness and decreased motor function ulnar aspect of left extremity. Constant in the fingers, but occasionally from the left axillary area. This corresponds to C8 vertebrae, and he has history of C6/C7 fusion. Consider cervical radiculopathy. Discussed with patient. We can try a short prednisone  burst for anti-inflammatory properties, but discussion that he will need to see orthopedics and likely physical therapy for further management. No red flags at this time. Discussed return and ED precautions. Agrees to plan  Final Clinical Impressions(s) / UC Diagnoses   Final diagnoses:  Cervical radiculopathy     Discharge Instructions      Starting tomorrow -- prednisone  2 tablets daily for 5 days This is an anti-inflammatory that may reduce the nerve symptoms  Please follow up with orthopedics      ED Prescriptions     Medication Sig Dispense Auth. Provider   predniSONE  (DELTASONE ) 20 MG tablet  (Status: Discontinued) Take 2 tablets (40 mg total) by mouth daily with breakfast for 5 days. 10 tablet Rillie Riffel, PA-C   predniSONE  (DELTASONE ) 20 MG tablet Take 2 tablets (40 mg total) by mouth daily with breakfast for 5 days. 10 tablet Armel Rabbani, Asberry, PA-C      PDMP not reviewed this encounter.   Jeryl Asberry RIGGERS 03/15/24 1907

## 2024-03-21 ENCOUNTER — Ambulatory Visit: Payer: Self-pay | Admitting: Family Medicine

## 2024-03-21 VITALS — BP 128/82 | Ht 73.0 in | Wt 165.0 lb

## 2024-03-21 DIAGNOSIS — G959 Disease of spinal cord, unspecified: Secondary | ICD-10-CM

## 2024-03-21 NOTE — Progress Notes (Unsigned)
 DATE OF VISIT: 03/21/2024        Douglas Mclaughlin DOB: 1976/07/07 MRN: [id number]  CC:  Left hand/finger numbness  History of present Illness: Douglas Mclaughlin is a 48 y.o. male who presents for evaluation of left hand/finger numbness and tingling.   Patient reports that he first began to experience numbness and tingling of his left 4th and 5th finger roughly two weeks ago. He says the numbness/tingling also travels into his forearm, possibly as far up as his axilla. He says his hand feels weak. He was seen at Urgent Care on 03/15/2024 and was given a 5 day prednisone  taper, he is on day 3 of the taper but does not believe it has been helpful. He does not have any pain in his left arm or hand and denies any recent history of trauma/falls. He had a cervical spinal fusion surgery of C6 and C7 in January of 2023 with Dr. Cheryle. He is right-handed.   Medications:  Outpatient Encounter Medications as of 03/21/2024  Medication Sig   predniSONE  (DELTASONE ) 20 MG tablet Take 2 tablets (40 mg total) by mouth daily with breakfast for 5 days.   No facility-administered encounter medications on file as of 03/21/2024.    Allergies: has no known allergies.  Physical Examination: Vitals: BP 128/82   Ht 6' 1 (1.854 m)   Wt 165 lb (74.8 kg)   BMI 21.77 kg/m  GENERAL:  Douglas Mclaughlin is a 48 y.o. male appearing their stated age, alert and oriented x 3, in no apparent distress.  SKIN: no rashes or lesions, skin clean, dry, intact MSK: Neck and left upper extremity  Inspection: There is a small pulsatile nodule on the dorsal left wrist, no other bony or soft tissue abnormalities appreciated.  Palpation: No tenderness to palpation of the digits, wrist, or forearm.  ROM: FROM with wrist flexion/extension and inversion/eversion. Limited flexion of the 4th and 5th digits of the left hand. FROM with 4th and 5th digit extension.  Strength: 2/5 with elbow extension, thumb opposition, otherwise 5/5 strength in upper left  extremity Special Test: Negative Spurling's   Neuro/Vasc: sensation intact to light touch, DTR 2/4 biceps and brachioradialis, 1/4 triceps reflex, pulses 2+ and symmetric radial pulses bilaterally  Radiology: 08/16/2021 MRI Cervical Spine:  1. Diffuse disc bulge with a superimposed central protrusion at L4-L5 resulting in moderate spinal canal stenosis with effacement of the subarticular zones and probable impingement of the traversing left L5 nerve root, and mild bilateral neural foraminal stenosis. 2. Minimal degenerative changes at the remaining levels without other significant spinal canal or neural foraminal stenosis. 3. 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  Assessment & Plan Cervical myelopathy (HCC)  - Patient's history and exam findings in clinic today are concerning for a potential cervical myelopathy, specifically of C8 given the distribution. Suspect patient's history of C6-C7 spinal fusion may be contributing to impingement of C8. Discussed with patient that imaging would be a likely next step in evaluation of his symptoms. However, given that he recently underwent spinal fusion surgery with Dr. Cheryle, recommended that patient schedule an appointment with Dr. Cheryle as soon as possible to determine his preferred next steps in management. In the meantime, advised patient to continue with prednisone  taper.   - Schedule follow-up with Dr. Cheryle  - Complete 5 day course of Prednisone    Patient expressed understanding & agreement with above.  No diagnosis found.  No orders of the  defined types were placed in this encounter.  Signe Ravel, MS4 [facility]

## 2024-03-21 NOTE — Patient Instructions (Signed)
 You have weakness in your arm and numbness into your left hand concerning for a nerve issue in your neck. Based on your history of neck surgery - you should call your previous surgeon (Dr Debby Conn with [facility]).   His office is now in [facility]. - we placed a referral to him for you You should continue your steroids as prescribed by urgent care Please let me know if you have any issues scheduling an appointment with him

## 2024-03-22 ENCOUNTER — Encounter: Payer: Self-pay | Admitting: Family Medicine

## 2024-03-27 ENCOUNTER — Other Ambulatory Visit: Payer: Self-pay

## 2024-03-27 DIAGNOSIS — G959 Disease of spinal cord, unspecified: Secondary | ICD-10-CM

## 2024-03-27 NOTE — Progress Notes (Signed)
 Pt was previously seen/operated on by Dr. Cheryle with [facility]. However, they are unable to schedule him at this time due to billing issues.  Referral will be placed to [facility] as patient is in the process of applying for [facility] financial assistance.

## 2024-04-17 NOTE — Progress Notes (Signed)
 Referring Physician:  Teressa Rainell BROCKS, DO [address] [place],  [place] [address]  Primary Physician:  Pcp, No  History of Present Illness: 04/22/2024 [name] is healthy.   History of cervical myelopathy with ACDF C6-C7 by Dr. Cheryle 08/16/21. He had balance issues and left foot weakness since surgery. He continued with unsteady gait. No dexterity issues after surgery.   Now with 6 week history of left hand weakness with dropping things along with numbness. No neck pain. He noted some improvement in symptoms after taking prednisone . No known injury. No right hand symptoms. History of chronic balance issues since above surgery- no change. No neck or arm pain.   Tobacco use: smokes 10-15 cigars every 2 days.   Bowel/Bladder Dysfunction: he has bladder urgency x 4 weeks. No perineal numbness.   Conservative measures:  Physical therapy:  Has not participated in Multimodal medical therapy including regular antiinflammatories:  prednisone   Injections:  epidural steroid injections  Past Surgery:   C6/C7 ACDF in 2023 with Dr. Rockney.  Douglas Mclaughlin has symptoms of cervical myelopathy.  The symptoms are causing a significant impact on the patient's life.   Review of Systems:  A 10 point review of systems is negative, except for the pertinent positives and negatives detailed in the HPI.  Past Medical History: History reviewed. No pertinent past medical history.  Past Surgical History: Past Surgical History:  Procedure Laterality Date   ANTERIOR CERVICAL DECOMP/DISCECTOMY FUSION N/A 08/17/2021   Procedure: ANTERIOR CERVICAL DECOMPRESSION/DISCECTOMY FUSION 1 LEVEL -  Cervical six to cervical seven;  Surgeon: Cheryle Debby LABOR, MD;  Location: [facility];  Service: Neurosurgery;  Laterality: N/A;    Allergies: Allergies as of 04/22/2024   (No Known Allergies)    Medications: No outpatient encounter medications on file as of 04/22/2024.   No facility-administered  encounter medications on file as of 04/22/2024.    Social History: Social History   Tobacco Use   Smoking status: Some Days    Types: Cigars  Substance Use Topics   Alcohol use: Not Currently   Drug use: Not Currently    Family Medical History: History reviewed. No pertinent family history.  Physical Examination: Vitals:   04/22/24 1304  BP: 134/72    General: Patient is well developed, well nourished, calm, collected, and in no apparent distress. Attention to examination is appropriate.  Respiratory: Patient is breathing without any difficulty.   NEUROLOGICAL:     Awake, alert, oriented to person, place, and time.  Speech is clear and fluent. Fund of knowledge is appropriate.   Cranial Nerves: Pupils equal round and reactive to light.  Facial tone is symmetric.    No abnormal lesions on exposed skin.   Strength: Side Biceps Triceps Deltoid Interossei Grip Wrist Ext. Wrist Flex.  R 5 5 5 5 5 5 5   L 5 4 5 4 4 5 5    Side Iliopsoas Quads Hamstring PF DF EHL  R 5 5 5 5 5 5   L 5 5 5 4 5 5    Reflexes are 3+ and symmetric at the biceps, brachioradialis, patella and achilles.   Hoffman's is positive.   + cross adductor reflex bilaterally.   Clonus is present and sustained in bilateral lowe extremities.   Bilateral upper and lower extremity sensation is intact to light touch, diminished in left hand  Gait is abnormal, left leg swings around in circle when he ambulates.   Medical Decision Making  Imaging: MRI cervical spine  dated 08/16/21:  FINDINGS: Alignment: Reversal of the cervical lordosis. Slight retrolisthesis at C4-5 and C5-6.   Vertebrae: No acute fracture. No evidence of discitis. Multilevel discogenic endplate marrow changes. No marrow replacing bone lesion. Mild diffuse intrinsic canal narrowing on the basis of congenitally short pedicles.   Cord: Focally increased T2/STIR signal within the cervical cord at the C6-7 level (series 2, image 10). Cord  is slightly diminutive in caliber at this location likely reflecting chronic myelomalacia. No sites of cord enlargement. No additional sites of cervical cord signal abnormality.   Posterior Fossa, vertebral arteries, paraspinal tissues: Negative.   Disc levels:   C2-C3: Left paracentral disc osteophyte complex contacts the left hemicord resulting in mild canal stenosis. Mild left foraminal stenosis.   C3-C4: Disc osteophyte complex, slightly eccentric to the left with left greater than right uncovertebral spurring. Findings result in mild-to-moderate canal stenosis with mild-moderate left and mild right foraminal stenosis.   C4-C5: Disc osteophyte complex with bilateral uncovertebral spurring resulting in moderate canal stenosis with moderate bilateral foraminal stenosis.   C5-C6: Disc osteophyte complex, eccentric to the left with uncovertebral spurring resulting in moderate canal stenosis with severe left and mild-moderate right foraminal stenosis.   C6-C7: Disc osteophyte complex, eccentric to the left with left greater than right uncovertebral spurring resulting in severe canal stenosis with severe left and mild-moderate right foraminal stenosis.   C7-T1: No disc protrusion. Left-sided uncovertebral spurring contributes to mild-moderate left-sided foraminal stenosis. No canal stenosis.   IMPRESSION: 1. 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 2. Focally increased T2/STIR signal within the cervical cord at the C6-7 level, suggesting compressive myelomalacia, likely chronic. 3. Severe left foraminal stenosis at C5-6 and C6-7. 4. Moderate canal stenosis at C4-5 and C5-6.     Electronically Signed   By: Mabel Converse D.O.   On: 08/16/2021 14:02   Thoracic MRI dated 08/16/21:  FINDINGS: Alignment: Mild scoliotic curvature convex to the left in the upper thoracic  region.   Vertebrae: No fracture or focal bone lesion.   Cord:  No cord compression or focal cord lesion.   Paraspinal and other soft tissues: Negative   Disc levels:   Disc levels are normal except for minimal non-compressive disc bulges at T2-3, T3-4 and T4-5. There is mild hypertrophic change of the facet joints at T2-3, T3-4 and T4-5, but there is no compressive narrowing of the canal. There is mild foraminal narrowing on the right at T3-4 and T4-5 and on the left at T2-3 and T3-4.   IMPRESSION: No fracture or focal lesion.   No central canal stenosis. No cord compression or focal cord lesion.   Minimal non-compressive disc bulges at T2-3, T3-4 and T4-5. Upper thoracic facet osteoarthritis with mild foraminal narrowing on the right at T3-4 and T4-5 and on the left at T2-3 and T3-4. Often, this level of abnormality is subclinical.     Electronically Signed   By: Oneil Officer M.D.   On: 08/16/2021 13:57   I have personally reviewed the images and agree with the above interpretation.  Above imaging reviewed with Dr. Claudene.   Assessment and Plan: Douglas Mclaughlin has a history of cervical myelopathy with ACDF C6-C7 by Dr. Cheryle 08/16/21. He had balance issues and left foot weakness since surgery and continued with unsteady gait. No dexterity issues after surgery.   Now with 6 week history of left hand weakness (dropping things) along with numbness.  No neck or arm pain. No known injury. No right hand symptoms.   MRI 08/16/21 from previous surgery showed myelomalacia C6-C7 that was likely chronic. No thoracic compression seen on MRI 08/16/21.   Treatment options discussed with patient and following plan made with Dr. Claudene:   - MRI of cervical spine to evaluate new left hand weakness and numbness.  - Previous thoracic MRI 08/16/21 showed no compression. Will hold on updated thoracic MRI.  - Given information on [employer]. He has no insurance.  - Will schedule  follow up visit to review MRI results once I get them back. If no new compression, consider referral to PMR in [facility] for chronic cervical myelopathy.   I spent a total of 30 minutes in face-to-face and non-face-to-face activities related to this patient's care today including review of outside records, review of imaging, review of symptoms, physical exam, discussion of differential diagnosis, discussion of treatment options, and documentation.   Thank you for involving me in the care of this patient.   Glade Boys PA-C Dept. of Neurosurgery

## 2024-04-22 ENCOUNTER — Encounter: Payer: Self-pay | Admitting: Orthopedic Surgery

## 2024-04-22 ENCOUNTER — Ambulatory Visit (INDEPENDENT_AMBULATORY_CARE_PROVIDER_SITE_OTHER): Payer: Self-pay | Admitting: Orthopedic Surgery

## 2024-04-22 VITALS — BP 134/72 | Ht 73.0 in | Wt 175.0 lb

## 2024-04-22 DIAGNOSIS — G959 Disease of spinal cord, unspecified: Secondary | ICD-10-CM

## 2024-04-22 DIAGNOSIS — R29898 Other symptoms and signs involving the musculoskeletal system: Secondary | ICD-10-CM

## 2024-04-22 DIAGNOSIS — R200 Anesthesia of skin: Secondary | ICD-10-CM

## 2024-04-22 NOTE — Patient Instructions (Signed)
 It was so nice to see you today. Thank you so much for coming in.    Your previous neck surgery was done for pressure on your spinal cord.   I want to get an MRI of your neck to look into things further. [facility] will call you to schedule the appointment.   After you have the MRI, it can take 14-28 days for me to get the results back. If I don't have them in 2 weeks, we will call to try to get the results.   Once I have the results, we will call you to schedule a follow up visit with me to review them.   I gave you information about [employer]. This can help with your medical bills.   Please do not hesitate to call if you have any questions or concerns. You can also message me in MyChart.   Glade Boys PA-C [phone]     The physicians and staff at [facility] at [facility] are committed to providing excellent care. You may receive a survey asking for feedback about your experience at our office. We value you your feedback and appreciate you taking the time to to fill it out. The [facility] leadership team is also available to discuss your experience in person, feel free to contact us  [phone].

## 2024-05-17 ENCOUNTER — Other Ambulatory Visit: Payer: Self-pay

## 2024-06-14 ENCOUNTER — Ambulatory Visit
Admission: RE | Admit: 2024-06-14 | Discharge: 2024-06-14 | Disposition: A | Discharge status: Home / Self Care | Payer: Self-pay | Source: Ambulatory Visit | Attending: Orthopedic Surgery | Admitting: Orthopedic Surgery

## 2024-06-14 DIAGNOSIS — G959 Disease of spinal cord, unspecified: Secondary | ICD-10-CM

## 2024-06-14 DIAGNOSIS — R29898 Other symptoms and signs involving the musculoskeletal system: Secondary | ICD-10-CM

## 2024-06-14 DIAGNOSIS — R200 Anesthesia of skin: Secondary | ICD-10-CM

## 2024-06-19 ENCOUNTER — Telehealth: Payer: Self-pay

## 2024-06-19 NOTE — Telephone Encounter (Signed)
 Me to Hilma Hastings, PA-C    06/19/24  4:19 PM Left generic message to return call (voicemail did not confirm pt's name). Gregory Edsel Ruth, PA to Me     06/19/24  3:57 PM Since he's been through this before and it sounds like she assumed he might have some stenosis, I think its probably better that we not delay setting up his follow up since she won't be able to review it until Monday at the earliest.  I would either add him for a telephone with her Monday first thing or just call and explain that it shows stenosis and that he needs to see one of the surgeons. Me to Hilma Hastings, PA-C  Gregory Edsel Ruth, [place]     06/19/24  3:07 PM Wasn't sure if Hastings would want to talk to him on the phone to discuss results first before setting him up w/ an MD. Also, per her last note, he doesn't have insurance and she gave him financial assistance info. View older events  Gregory Edsel Ruth, [place] to Me (Selected Message)     06/19/24  2:59 PM He needs to see MD given this MRI. I think it looks worse than the report makes it sound

## 2024-06-24 ENCOUNTER — Ambulatory Visit: Payer: Self-pay | Admitting: Orthopedic Surgery

## 2024-06-24 NOTE — Telephone Encounter (Signed)
 I spoke with Mr Loftus. I scheduled him with Dr Clois on 07/09/24. He works night shift, so he requested a Friday or an appointment at 3pm or later and that was the next available appointment that fit that criteria.

## 2024-06-24 NOTE — Telephone Encounter (Signed)
 I spoke with Mr Douglas Mclaughlin. I scheduled him with Dr Clois on 07/09/24. He works night shift, so he requested a Friday or an appointment at 3pm or later and that was the next available appointment that fit that criteria.

## 2024-07-05 NOTE — Progress Notes (Signed)
 Referring Physician:  No referring provider defined for this encounter.  Primary Physician:  Pcp, No  History of Present Illness: 07/09/2024 [name] is here today with a chief complaint of cervical myelopathy who presents with worsening balance issues and left arm numbness.  He reports several months of progressive balance problems and left arm numbness that now interfere with daily activities. He describes weakness and tingling in two fingers of the left hand with decreased grip strength and difficulty opening jars and performing fine motor tasks, and intermittent pain radiating up the arm.  He had spinal cord dysfunction treated with surgery about four years ago but has had a persistent magnetic gait since before that operation, without clear recent change. He continues to notice catching of his toe while walking and has not used orthotics but is interested in them to improve gait.  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   Discussed the use of AI scribe software for clinical note transcription with the patient, who gave verbal consent to proceed.  Douglas Mclaughlin has clear symptoms of cervical myelopathy.  The symptoms are causing a significant impact on the patient's life.   I have utilized the care everywhere function in epic to review the outside records available from external health systems.   Progress Note from Douglas Mclaughlin, Douglas Mclaughlin on 07/05/24:   History of Present Illness: 04/22/2024 [name] is healthy.    History of cervical myelopathy with ACDF C6-C7 by Dr. Cheryle 08/16/21. He had balance issues and left foot weakness since surgery. He continued with unsteady gait. No dexterity issues after surgery.    Now with 6 week history of left hand weakness with dropping things along with numbness. No neck pain. He noted some improvement in symptoms after taking prednisone . No known injury. No right hand symptoms. History of chronic balance issues  since above surgery- no change. No neck Mclaughlin arm pain.    Tobacco use: smokes 10-15 cigars every 2 days.    Bowel/Bladder Dysfunction: he has bladder urgency x 4 weeks. No perineal numbness.    Conservative measures:  Physical therapy:  Has not participated in Multimodal medical therapy including regular antiinflammatories:  prednisone   Injections:  epidural steroid injections   Past Surgery:   C6/C7 ACDF in 2023 with Dr. Rockney.    Review of Systems:  A 10 point review of systems is negative, except for the pertinent positives and negatives detailed in the HPI.  Past Medical History: History reviewed. No pertinent past medical history.  Past Surgical History: Past Surgical History:  Procedure Laterality Date   ANTERIOR CERVICAL DECOMP/DISCECTOMY FUSION N/A 08/17/2021   Procedure: ANTERIOR CERVICAL DECOMPRESSION/DISCECTOMY FUSION 1 LEVEL -  Cervical six to cervical seven;  Surgeon: Douglas [facility]claughlin LABOR, MD;  Location: [facility];  Service: Neurosurgery;  Laterality: N/A;    Allergies: Allergies as of 07/09/2024   (No Known Allergies)    Medications: Current Medications[1]  Social History: Social History[2]  Family Medical History: History reviewed. No pertinent family history.  Physical Examination: Vitals:   07/09/24 1509  BP: 134/86    General: Patient is in no apparent distress. Attention to examination is appropriate.  Neck:   Supple.  Full range of motion.  Respiratory: Patient is breathing without any difficulty.   NEUROLOGICAL:     Awake, alert, oriented to person, place, and time.  Speech is clear and fluent.   Cranial Nerves: Pupils equal round and reactive to light.  Facial  tone is symmetric.  Facial sensation is symmetric. Shoulder shrug is symmetric. Tongue protrusion is midline.  There is no pronator drift.  Strength: Side Biceps Triceps Deltoid Interossei Grip Wrist Ext. Wrist Flex.  R 5 5 5 5 5 5 5   L 5 4+ 5 4+ 4 4+ 4+   Side Iliopsoas Quads  Hamstring PF DF EHL  R 5 5 5 5 5 5   L 4+ 5 5 5 5 5    Reflexes are 3+ and symmetric at the biceps, triceps, brachioradialis, patella and achilles.   Hoffman's is present.   Bilateral upper and lower extremity sensation is intact to light touch.    No evidence of dysmetria noted.  Gait is very abnormal.  He cannot perform tandem walk.     Medical Decision Making  Imaging: MRI C spine 06/14/2024  IMPRESSION: 1. Interval C6-7 ACDF with widely patent spinal canal and myelomalacia. 2. 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 3. Severe multilevel neural foraminal stenosis.   Electronically signed by: Douglas Mclaughlin Hamburg MD 06/18/2024 05:36 PM EST RP Workstation: [id number] I have personally reviewed the images and agree with the above interpretation.  Assessment and Plan: [name] is a pleasant 48 y.o. male with cervical myelopathy with progressive worsening of his left arm dysfunction.  He has worsening stenosis at C4-5 and C5-6.  His C3-4 level was read as moderate stenosis, but I do not feel that that is as bad as C4-5 and C5-6.  He has progressive cervical spondylotic myelopathy.  There is no role for conservative management.  I recommend surgical intervention with a C4-6 anterior cervical discectomy and fusion.  We reviewed the risks and benefits.  He would like to think about this.  I have referred him to ENT for vocal cord analysis.  After I receive those results, I will speak with [name] and we will make plans.  I discussed the planned procedure at length with the patient, including the risks, benefits, alternatives, and indications. The risks discussed include but are not limited to bleeding, infection, need for reoperation, spinal fluid leak, stroke, vision loss, anesthetic complication, coma, paralysis, and even death. We also discussed the possibility of post-operative dysphagia, vocal  cord paralysis, and the risk of adjacent segment disease in the future. I also described in detail that improvement was not guaranteed.  The patient expressed understanding of these risks. I described the surgery in layman's terms, and gave ample opportunity for questions, which were answered to the best of my ability.   I spent a total of 30 minutes in this patient's care today. This time was spent reviewing pertinent records including imaging studies, obtaining and confirming history, performing a directed evaluation, formulating and discussing my recommendations, and documenting the visit within the medical record.      Thank you for involving me in the care of this patient.      Helen Winterhalter K. Clois MD, [facility]     [1] No current outpatient medications on file. [2]  Social History Tobacco Use   Smoking status: Some Days    Types: Cigars  Substance Use Topics   Alcohol use: Not Currently   Drug use: Not Currently

## 2024-07-09 ENCOUNTER — Ambulatory Visit: Payer: Self-pay | Admitting: Neurosurgery

## 2024-07-09 ENCOUNTER — Encounter: Payer: Self-pay | Admitting: Neurosurgery

## 2024-07-09 VITALS — BP 134/86 | Ht 73.0 in | Wt 171.0 lb

## 2024-07-09 DIAGNOSIS — M4802 Spinal stenosis, cervical region: Secondary | ICD-10-CM

## 2024-07-09 DIAGNOSIS — Z981 Arthrodesis status: Secondary | ICD-10-CM

## 2024-07-09 DIAGNOSIS — G959 Disease of spinal cord, unspecified: Secondary | ICD-10-CM

## 2024-07-11 ENCOUNTER — Encounter (INDEPENDENT_AMBULATORY_CARE_PROVIDER_SITE_OTHER): Payer: Self-pay

## 2024-08-27 ENCOUNTER — Encounter (INDEPENDENT_AMBULATORY_CARE_PROVIDER_SITE_OTHER): Payer: Self-pay | Admitting: Otolaryngology

## 2024-08-27 ENCOUNTER — Ambulatory Visit (INDEPENDENT_AMBULATORY_CARE_PROVIDER_SITE_OTHER): Payer: Self-pay | Admitting: Otolaryngology

## 2024-08-27 VITALS — BP 147/92 | HR 69 | Ht 73.0 in | Wt 170.0 lb

## 2024-08-27 DIAGNOSIS — J383 Other diseases of vocal cords: Secondary | ICD-10-CM

## 2024-08-27 DIAGNOSIS — F17290 Nicotine dependence, other tobacco product, uncomplicated: Secondary | ICD-10-CM
